# Patient Record
Sex: Female | Born: 1995 | Hispanic: No | Marital: Single | Smoking: Current every day smoker
Health system: Southern US, Community
[De-identification: ages and names within clinical notes are randomized; demographics above are authoritative.]

## PROBLEM LIST (undated history)

## (undated) ENCOUNTER — Ambulatory Visit (HOSPITAL_COMMUNITY): Admission: EM | Payer: Self-pay | Source: Home / Self Care

## (undated) DIAGNOSIS — J45909 Unspecified asthma, uncomplicated: Secondary | ICD-10-CM

## (undated) DIAGNOSIS — E8881 Metabolic syndrome: Secondary | ICD-10-CM

## (undated) HISTORY — DX: Metabolic syndrome: E88.81

---
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
## 2017-01-24 ENCOUNTER — Emergency Department (HOSPITAL_COMMUNITY): Payer: Medicaid Other

## 2017-01-24 ENCOUNTER — Encounter (HOSPITAL_COMMUNITY): Payer: Self-pay | Admitting: Emergency Medicine

## 2017-01-24 ENCOUNTER — Emergency Department (HOSPITAL_COMMUNITY)
Admission: EM | Admit: 2017-01-24 | Discharge: 2017-01-24 | Disposition: A | Discharge status: Home / Self Care | Payer: Medicaid Other | Attending: Emergency Medicine | Admitting: Emergency Medicine

## 2017-01-24 DIAGNOSIS — J45909 Unspecified asthma, uncomplicated: Secondary | ICD-10-CM | POA: Diagnosis not present

## 2017-01-24 DIAGNOSIS — F909 Attention-deficit hyperactivity disorder, unspecified type: Secondary | ICD-10-CM | POA: Insufficient documentation

## 2017-01-24 DIAGNOSIS — Y939 Activity, unspecified: Secondary | ICD-10-CM | POA: Diagnosis not present

## 2017-01-24 DIAGNOSIS — Y92410 Unspecified street and highway as the place of occurrence of the external cause: Secondary | ICD-10-CM | POA: Insufficient documentation

## 2017-01-24 DIAGNOSIS — M25531 Pain in right wrist: Secondary | ICD-10-CM | POA: Diagnosis not present

## 2017-01-24 DIAGNOSIS — Z7722 Contact with and (suspected) exposure to environmental tobacco smoke (acute) (chronic): Secondary | ICD-10-CM | POA: Insufficient documentation

## 2017-01-24 DIAGNOSIS — Y999 Unspecified external cause status: Secondary | ICD-10-CM | POA: Diagnosis not present

## 2017-01-24 DIAGNOSIS — S161XXA Strain of muscle, fascia and tendon at neck level, initial encounter: Secondary | ICD-10-CM | POA: Diagnosis not present

## 2017-01-24 DIAGNOSIS — S0990XA Unspecified injury of head, initial encounter: Secondary | ICD-10-CM | POA: Insufficient documentation

## 2017-01-24 DIAGNOSIS — S199XXA Unspecified injury of neck, initial encounter: Secondary | ICD-10-CM | POA: Diagnosis present

## 2017-01-24 LAB — I-STAT BETA HCG BLOOD, ED (MC, WL, AP ONLY): I-stat hCG, quantitative: <5 m[IU]/mL (ref <5)

## 2017-01-24 MED ORDER — IBUPROFEN 800 MG PO TABS: 800.0000 mg | ORAL_TABLET | Freq: Three times a day (TID) | ORAL | 0 refills | Status: DC | PRN

## 2017-01-24 NOTE — Discharge Instructions (Signed)
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
## 2017-01-24 NOTE — ED Notes (Signed)
Patient transported to X-ray 
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
## 2017-05-15 ENCOUNTER — Emergency Department (HOSPITAL_COMMUNITY)
Admission: EM | Admit: 2017-05-15 | Discharge: 2017-05-15 | Disposition: A | Discharge status: Home / Self Care | Payer: Medicaid Other | Attending: Emergency Medicine | Admitting: Emergency Medicine

## 2017-05-15 ENCOUNTER — Encounter (HOSPITAL_COMMUNITY): Payer: Self-pay | Admitting: *Deleted

## 2017-05-15 DIAGNOSIS — M546 Pain in thoracic spine: Secondary | ICD-10-CM

## 2017-05-15 DIAGNOSIS — R519 Headache, unspecified: Secondary | ICD-10-CM

## 2017-05-15 DIAGNOSIS — J45909 Unspecified asthma, uncomplicated: Secondary | ICD-10-CM | POA: Diagnosis not present

## 2017-05-15 DIAGNOSIS — R51 Headache: Secondary | ICD-10-CM | POA: Diagnosis present

## 2017-05-15 DIAGNOSIS — F17210 Nicotine dependence, cigarettes, uncomplicated: Secondary | ICD-10-CM | POA: Insufficient documentation

## 2017-05-15 HISTORY — DX: Unspecified asthma, uncomplicated: J45.909

## 2017-05-15 MED ORDER — METHOCARBAMOL 500 MG PO TABS: 500.0000 mg | ORAL_TABLET | Freq: Two times a day (BID) | ORAL | 0 refills | Status: DC | PRN

## 2017-05-15 MED ORDER — ACETAMINOPHEN 325 MG PO TABS
650.0000 mg | ORAL_TABLET | Freq: Once | ORAL | Status: AC
  Administered 2017-05-15: 650 mg via ORAL
  Filled 2017-05-15: qty 2

## 2017-05-15 MED ORDER — NAPROXEN 250 MG PO TABS: 250.0000 mg | ORAL_TABLET | Freq: Two times a day (BID) | ORAL | 0 refills | Status: DC
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
## 2017-08-23 ENCOUNTER — Ambulatory Visit: Payer: Medicaid Other | Admitting: Family Medicine

## 2017-09-04 ENCOUNTER — Ambulatory Visit: Payer: Self-pay | Admitting: Family Medicine

## 2017-10-04 ENCOUNTER — Emergency Department (HOSPITAL_COMMUNITY): Payer: Self-pay

## 2017-10-04 ENCOUNTER — Emergency Department (HOSPITAL_COMMUNITY)
Admission: EM | Admit: 2017-10-04 | Discharge: 2017-10-04 | Disposition: A | Discharge status: Home / Self Care | Payer: Self-pay | Attending: Emergency Medicine | Admitting: Emergency Medicine

## 2017-10-04 ENCOUNTER — Encounter (HOSPITAL_COMMUNITY): Payer: Self-pay | Admitting: Emergency Medicine

## 2017-10-04 DIAGNOSIS — Z79899 Other long term (current) drug therapy: Secondary | ICD-10-CM | POA: Insufficient documentation

## 2017-10-04 DIAGNOSIS — F909 Attention-deficit hyperactivity disorder, unspecified type: Secondary | ICD-10-CM | POA: Insufficient documentation

## 2017-10-04 DIAGNOSIS — Z7722 Contact with and (suspected) exposure to environmental tobacco smoke (acute) (chronic): Secondary | ICD-10-CM | POA: Insufficient documentation

## 2017-10-04 DIAGNOSIS — J45909 Unspecified asthma, uncomplicated: Secondary | ICD-10-CM | POA: Insufficient documentation

## 2017-10-04 DIAGNOSIS — L03113 Cellulitis of right upper limb: Secondary | ICD-10-CM | POA: Insufficient documentation

## 2017-10-04 MED ORDER — CEPHALEXIN 250 MG PO CAPS
500.0000 mg | ORAL_CAPSULE | Freq: Once | ORAL | Status: AC
  Administered 2017-10-04: 500 mg via ORAL
  Filled 2017-10-04: qty 2

## 2017-10-04 MED ORDER — CEPHALEXIN 500 MG PO CAPS: 500.0000 mg | ORAL_CAPSULE | Freq: Four times a day (QID) | ORAL | 0 refills | Status: AC

## 2017-10-04 NOTE — ED Triage Notes (Signed)
Pt to ER for evaluation of right hand redness and swelling onset yesterday, denies injury. Denies bug bite. Pt states "I don't know what happened." NAD.

## 2017-10-04 NOTE — ED Notes (Signed)
Patient verbalized understanding of discharge instructions and denies any further needs or questions at this time. VS stable. Patient ambulatory with steady gait.  

## 2017-10-04 NOTE — ED Provider Notes (Signed)
[facility] EMERGENCY DEPARTMENT Provider Note   CSN: [id number] Arrival date & time: 10/04/17  1632     History   Chief Complaint Chief Complaint  Patient presents with  . Hand Pain    HPI Envy Meno is a 21 y.o. female who presents to the emergency department with a chief complaint of right hand and wrist redness, warmth, and swelling that began yesterday.  The patient denies known trauma, injury, insect bite, or breaks in the skin.  She reports the redness and swelling began at the right wrist and have radiated up the back of the hand toward the fingers.  She denies fever, chills. no previous surgeries or injuries.  No history of gout or DM. She is right-handed.  The history is provided by the patient and a significant other. No language interpreter was used.    Past Medical History:  Diagnosis Date  . Asthma   . Metabolic syndrome 11/11/2012   From old records    Patient Active Problem List   Diagnosis Date Noted  . Mild persistent asthma without complication 08/25/2014  . Metabolic syndrome 11/11/2012  . ADHD (attention deficit hyperactivity disorder) 04/23/2012    History reviewed. No pertinent surgical history.  OB History    No data available       Home Medications    Prior to Admission medications   Medication Sig Start Date End Date Taking? Authorizing Provider  albuterol (PROVENTIL HFA;VENTOLIN HFA) 108 (90 BASE) MCG/ACT inhaler Inhale 2 puffs into the lungs every 4 (four) hours as needed. Always use spacer. Patient not taking: Reported on 01/24/2017 08/25/14   Tilman Neat, MD  beclomethasone (QVAR) 40 MCG/ACT inhaler Inhale 2 puffs into the lungs 2 (two) times daily. Always shake well and always use spacer. Patient not taking: Reported on 01/24/2017 08/25/14   Tilman Neat, MD  cephALEXin (KEFLEX) 500 MG capsule Take 1 capsule (500 mg total) by mouth 4 (four) times daily. 10/04/17 10/11/17  Nicha Hemann A, PA-C  methocarbamol  (ROBAXIN) 500 MG tablet Take 1 tablet (500 mg total) by mouth 2 (two) times daily as needed for muscle spasms. 05/15/17   Everlene Farrier, PA-C  naproxen (NAPROSYN) 250 MG tablet Take 1 tablet (250 mg total) by mouth 2 (two) times daily with a meal. 05/15/17   Everlene Farrier, PA-C    Family History History reviewed. No pertinent family history.  Social History Social History  Substance Use Topics  . Smoking status: Passive Smoke Exposure - Never Smoker  . Smokeless tobacco: Never Used  . Alcohol use No     Allergies   Penicillins   Review of Systems Review of Systems  Constitutional: Negative for activity change, chills and fever.  Respiratory: Negative for shortness of breath.   Cardiovascular: Negative for chest pain.  Gastrointestinal: Negative for abdominal pain.  Musculoskeletal: Positive for myalgias. Negative for arthralgias and back pain.  Skin: Positive for color change. Negative for rash.  Neurological: Negative for weakness and numbness.     Physical Exam Updated Vital Signs BP (!) 111/55 (BP Location: Left Arm)   Pulse 62   Temp 98.2 F (36.8 C) (Oral)   Resp 18   SpO2 100%   Physical Exam  Constitutional: No distress.  HENT:  Head: Normocephalic.  Eyes: Conjunctivae are normal.  Neck: Neck supple.  Cardiovascular: Normal rate and regular rhythm.  Exam reveals no gallop and no friction rub.   No murmur heard. Pulmonary/Chest: Effort normal. No respiratory distress.  Abdominal: Soft. She exhibits no distension.  Musculoskeletal:  Well demarcated area of erythema from the right wrist extending up to the MCPs on the dorsum of the right hand with warm and edema.  No involvement of the fingers.  No streaking noted.  Full active and passive range of motion of the right wrist.  No puncture wounds or skin breaks noted.  Moves all fingers independently.  Radial pulses are 2+ and symmetric.  Sensation is intact throughout the distal tips of the fingertips.     Neurological: She is alert.  Skin: Skin is warm. No rash noted.  Psychiatric: Her behavior is normal.  Nursing note and vitals reviewed.    ED Treatments / Results  Labs (all labs ordered are listed, but only abnormal results are displayed) Labs Reviewed - No data to display  EKG  EKG Interpretation None       Radiology Dg Wrist Complete Right  Result Date: 10/04/2017 CLINICAL DATA:  21 y/o F; right hand pain, redness, and swelling without trauma or injury. EXAM: RIGHT HAND - COMPLETE 3+ VIEW; RIGHT WRIST - COMPLETE 3+ VIEW COMPARISON:  01/24/2017 right wrist radiograph FINDINGS: Right hand: There is no evidence of fracture or dislocation. There is no evidence of arthropathy or other focal bone abnormality. Soft tissues are unremarkable. Right wrist: There is no evidence of fracture or dislocation. There is no evidence of arthropathy or other focal bone abnormality. Soft tissues are unremarkable. IMPRESSION: Negative. Electronically Signed   By: Mitzi Hansen M.D.   On: 10/04/2017 18:29   Dg Hand Complete Right  Result Date: 10/04/2017 CLINICAL DATA:  21 y/o F; right hand pain, redness, and swelling without trauma or injury. EXAM: RIGHT HAND - COMPLETE 3+ VIEW; RIGHT WRIST - COMPLETE 3+ VIEW COMPARISON:  01/24/2017 right wrist radiograph FINDINGS: Right hand: There is no evidence of fracture or dislocation. There is no evidence of arthropathy or other focal bone abnormality. Soft tissues are unremarkable. Right wrist: There is no evidence of fracture or dislocation. There is no evidence of arthropathy or other focal bone abnormality. Soft tissues are unremarkable. IMPRESSION: Negative. Electronically Signed   By: Mitzi Hansen M.D.   On: 10/04/2017 18:29    Procedures Procedures (including critical care time)  Medications Ordered in ED Medications  cephALEXin (KEFLEX) capsule 500 mg (500 mg Oral Given 10/04/17 1852)     Initial Impression / Assessment and  Plan / ED Course  I have reviewed the triage vital signs and the nursing notes.  Pertinent labs & imaging results that were available during my care of the patient were reviewed by me and considered in my medical decision making (see chart for details).     Pt is without risk factors for HIV; no recent use of steroids or other immunosuppressive medications; no Hx of diabetes.  Pt is without gross abscess for which I&D would be possible.  Area marked and pt encouraged to return if redness begins to streak, extends beyond the markings, and/or fever or nausea/vomiting develop.  Pt is alert, oriented, NAD, afebrile, non tachycardic, nonseptic and nontoxic appearing.  Pt to be d/c on oral Keflex with strict f/u instructions.  No acute distress.  Vital signs stable.  The patient is safe for discharge at this time.  Final Clinical Impressions(s) / ED Diagnoses   Final diagnoses:  Cellulitis of right hand    New Prescriptions New Prescriptions   CEPHALEXIN (KEFLEX) 500 MG CAPSULE    Take 1 capsule (500 mg total)  by mouth 4 (four) times daily.     Barkley BoardsMcDonald, Romell Wolden A, PA-C 10/04/17 Pamella [id number]    Haviland, Julie, MD 10/04/17 Windell [id number]

## 2017-10-04 NOTE — Discharge Instructions (Signed)
Take 1 tablet of Keflex (cephalexin) every 6 hours for the next 7 days.  Your first dose was given tonight in the emergency department.  You can take 600 mg of ibuprofen once every 6-8 hours for pain and inflammation control.  Ice can be applied for 15-20 minutes up to 3-4 times a day   To help with pain and inflammation.  Your symptoms should start to improve about 72 hours (3 days) after you have been taking antibiotics.  If the redness and swelling continues to worsen on or after Monday, please return to the emergency department or follow up with primary care for re-evaluation.  You can call the number on your discharge paperwork to get established with a primary care provider.  If you develop worsening symptoms such as fever, chills, or worsening redness, swelling, and pain to the right arm and hand after he been taking antibiotics for 72 hours, please return to the emergency department for re-evaluation.

## 2017-10-26 ENCOUNTER — Encounter (HOSPITAL_COMMUNITY): Payer: Self-pay | Admitting: Emergency Medicine

## 2017-10-26 ENCOUNTER — Emergency Department (HOSPITAL_COMMUNITY)
Admission: EM | Admit: 2017-10-26 | Discharge: 2017-10-26 | Disposition: A | Discharge status: Home / Self Care | Payer: Self-pay | Attending: Emergency Medicine | Admitting: Emergency Medicine

## 2017-10-26 ENCOUNTER — Emergency Department (HOSPITAL_COMMUNITY): Payer: Self-pay

## 2017-10-26 DIAGNOSIS — J4541 Moderate persistent asthma with (acute) exacerbation: Secondary | ICD-10-CM | POA: Insufficient documentation

## 2017-10-26 DIAGNOSIS — Z7722 Contact with and (suspected) exposure to environmental tobacco smoke (acute) (chronic): Secondary | ICD-10-CM | POA: Insufficient documentation

## 2017-10-26 MED ORDER — PREDNISONE 20 MG PO TABS
60.0000 mg | ORAL_TABLET | Freq: Once | ORAL | Status: AC
  Administered 2017-10-26: 60 mg via ORAL
  Filled 2017-10-26: qty 3

## 2017-10-26 MED ORDER — ALBUTEROL SULFATE HFA 108 (90 BASE) MCG/ACT IN AERS: 2.0000 | INHALATION_SPRAY | RESPIRATORY_TRACT | 2 refills | Status: DC | PRN

## 2017-10-26 MED ORDER — ALBUTEROL SULFATE (2.5 MG/3ML) 0.083% IN NEBU
5.0000 mg | INHALATION_SOLUTION | Freq: Once | RESPIRATORY_TRACT | Status: AC
  Administered 2017-10-26: 5 mg via RESPIRATORY_TRACT
  Filled 2017-10-26: qty 6

## 2017-10-26 MED ORDER — PREDNISONE 20 MG PO TABS: 40.0000 mg | ORAL_TABLET | Freq: Every day | ORAL | 0 refills | Status: DC

## 2017-10-26 NOTE — ED Notes (Signed)
Patient transported to X-ray 

## 2017-10-26 NOTE — ED Triage Notes (Signed)
Pt to ED for wheezing that started yesterday. Pt has tried albuterol inhaler w/ no relief. Pt states she has started to feel sick recently.

## 2017-10-26 NOTE — ED Provider Notes (Signed)
[facility]   CSN: [id number] Arrival date & time: 10/26/17  0451     History   Chief Complaint Chief Complaint  Patient presents with  . Wheezing    HPI Margaret Terry is a 21 y.o. female.  Patient presents to the emergency department for evaluation of cough, chest congestion, wheezing and increased asthma symptoms.  Patient reports she started feeling symptoms yesterday, worsened through the night.  No noted fevers.      Past Medical History:  Diagnosis Date  . Asthma   . Metabolic syndrome 11/11/2012   From old records    Patient Active Problem List   Diagnosis Date Noted  . Mild persistent asthma without complication 08/25/2014  . Metabolic syndrome 11/11/2012  . ADHD (attention deficit hyperactivity disorder) 04/23/2012    History reviewed. No pertinent surgical history.  OB History    No data available       Home Medications    Prior to Admission medications   Medication Sig Start Date End Date Taking? Authorizing Provider  albuterol (PROVENTIL HFA;VENTOLIN HFA) 108 (90 BASE) MCG/ACT inhaler Inhale 2 puffs into the lungs every 4 (four) hours as needed. Always use spacer. Patient not taking: Reported on 01/24/2017 08/25/14   Tilman NeatProse, Claudia C, MD  beclomethasone (QVAR) 40 MCG/ACT inhaler Inhale 2 puffs into the lungs 2 (two) times daily. Always shake well and always use spacer. Patient not taking: Reported on 01/24/2017 08/25/14   Tilman NeatProse, Claudia C, MD  methocarbamol (ROBAXIN) 500 MG tablet Take 1 tablet (500 mg total) by mouth 2 (two) times daily as needed for muscle spasms. 05/15/17   Everlene Farrieransie, William, PA-C  naproxen (NAPROSYN) 250 MG tablet Take 1 tablet (250 mg total) by mouth 2 (two) times daily with a meal. 05/15/17   Everlene Farrieransie, William, PA-C    Family History History reviewed. No pertinent family history.  Social History Social History   Tobacco Use  . Smoking status: Passive Smoke Exposure -  Never Smoker  . Smokeless tobacco: Never Used  Substance Use Topics  . Alcohol use: No  . Drug use: No     Allergies   Penicillins   Review of Systems Review of Systems  Respiratory: Positive for cough, shortness of breath and wheezing.   All other systems reviewed and are negative.    Physical Exam Updated Vital Signs BP 121/64 (BP Location: Right Arm)   Pulse 95   Temp 98.6 F (37 C) (Oral)   Resp (!) 24   Ht 5\' 6"  (1.676 m)   Wt 68 kg (150 lb)   LMP 10/03/2017 (Approximate)   SpO2 95%   BMI 24.21 kg/m   Physical Exam  Constitutional: She is oriented to person, place, and time. She appears well-developed and well-nourished. No distress.  HENT:  Head: Normocephalic and atraumatic.  Right Ear: Hearing normal.  Left Ear: Hearing normal.  Nose: Nose normal.  Mouth/Throat: Oropharynx is clear and moist and mucous membranes are normal.  Eyes: Conjunctivae and EOM are normal. Pupils are equal, round, and reactive to light.  Neck: Normal range of motion. Neck supple.  Cardiovascular: Regular rhythm, S1 normal and S2 normal. Exam reveals no gallop and no friction rub.  No murmur heard. Pulmonary/Chest: Effort normal and breath sounds normal. No respiratory distress. She exhibits no tenderness.  Abdominal: Soft. Normal appearance and bowel sounds are normal. There is no hepatosplenomegaly. There is no tenderness. There is no rebound, no guarding, no tenderness at McBurney's point  and negative Murphy's sign. No hernia.  Musculoskeletal: Normal range of motion.  Neurological: She is alert and oriented to person, place, and time. She has normal strength. No cranial nerve deficit or sensory deficit. Coordination normal. GCS eye subscore is 4. GCS verbal subscore is 5. GCS motor subscore is 6.  Skin: Skin is warm, dry and intact. No rash noted. No cyanosis.  Psychiatric: She has a normal mood and affect. Her speech is normal and behavior is normal. Thought content normal.    Nursing note and vitals reviewed.    ED Treatments / Results  Labs (all labs ordered are listed, but only abnormal results are displayed) Labs Reviewed - No data to display  EKG  EKG Interpretation None       Radiology Dg Chest 2 View  Result Date: 10/26/2017 CLINICAL DATA:  Wheezing starting yesterday. No relief with inhaler. EXAM: CHEST  2 VIEW COMPARISON:  None. FINDINGS: Shallow inspiration. Normal heart size and pulmonary vascularity. No focal airspace disease or consolidation in the lungs. No blunting of costophrenic angles. No pneumothorax. Mediastinal contours appear intact. IMPRESSION: No active cardiopulmonary disease. Electronically Signed   By: Burman NievesWilliam  Stevens M.D.   On: 10/26/2017 05:55    Procedures Procedures (including critical care time)  Medications Ordered in ED Medications  predniSONE (DELTASONE) tablet 60 mg (not administered)  albuterol (PROVENTIL) (2.5 MG/3ML) 0.083% nebulizer solution 5 mg (5 mg Nebulization Given 10/26/17 0533)     Initial Impression / Assessment and Plan / ED Course  I have reviewed the triage vital signs and the nursing notes.  Pertinent labs & imaging results that were available during my care of the patient were reviewed by me and considered in my medical decision making (see chart for details).     Patient presents with complaints of cough, chest congestion and worsening asthma symptoms.  She has not hypoxic or tachycardic.  She does not have a fever.  Chest x-ray negative, no signs of pneumonia.  Patient administered albuterol here in the ER, will continue bronchodilator therapy and add prednisone.  Final Clinical Impressions(s) / ED Diagnoses   Final diagnoses:  Moderate persistent asthma with exacerbation    ED Discharge Orders    None       Gilda CreasePollina, Earland Reish J, MD 10/26/17 [phone]

## 2017-10-26 NOTE — ED Notes (Signed)
Pt verbalized understanding of d/c instructions and has no further questions. Pt is stable, A&Ox4, VSS.  

## 2017-10-27 ENCOUNTER — Emergency Department (HOSPITAL_COMMUNITY)
Admission: EM | Admit: 2017-10-27 | Discharge: 2017-10-28 | Disposition: A | Discharge status: Home / Self Care | Payer: Self-pay | Attending: Emergency Medicine | Admitting: Emergency Medicine

## 2017-10-27 ENCOUNTER — Other Ambulatory Visit: Payer: Self-pay

## 2017-10-27 ENCOUNTER — Encounter (HOSPITAL_COMMUNITY): Payer: Self-pay | Admitting: Emergency Medicine

## 2017-10-27 DIAGNOSIS — M791 Myalgia, unspecified site: Secondary | ICD-10-CM | POA: Insufficient documentation

## 2017-10-27 DIAGNOSIS — R05 Cough: Secondary | ICD-10-CM | POA: Insufficient documentation

## 2017-10-27 DIAGNOSIS — F17290 Nicotine dependence, other tobacco product, uncomplicated: Secondary | ICD-10-CM | POA: Insufficient documentation

## 2017-10-27 DIAGNOSIS — J111 Influenza due to unidentified influenza virus with other respiratory manifestations: Secondary | ICD-10-CM

## 2017-10-27 DIAGNOSIS — R062 Wheezing: Secondary | ICD-10-CM | POA: Insufficient documentation

## 2017-10-27 DIAGNOSIS — F909 Attention-deficit hyperactivity disorder, unspecified type: Secondary | ICD-10-CM | POA: Insufficient documentation

## 2017-10-27 DIAGNOSIS — E871 Hypo-osmolality and hyponatremia: Secondary | ICD-10-CM | POA: Insufficient documentation

## 2017-10-27 DIAGNOSIS — R69 Illness, unspecified: Secondary | ICD-10-CM | POA: Insufficient documentation

## 2017-10-27 MED ORDER — ONDANSETRON HCL 4 MG/2ML IJ SOLN
4.0000 mg | Freq: Once | INTRAMUSCULAR | Status: AC
  Administered 2017-10-28: 4 mg via INTRAVENOUS
  Filled 2017-10-27: qty 2

## 2017-10-27 MED ORDER — METHYLPREDNISOLONE SODIUM SUCC 125 MG IJ SOLR
125.0000 mg | Freq: Once | INTRAMUSCULAR | Status: AC
  Administered 2017-10-28: 125 mg via INTRAVENOUS
  Filled 2017-10-27: qty 2

## 2017-10-27 MED ORDER — ACETAMINOPHEN 325 MG PO TABS
650.0000 mg | ORAL_TABLET | Freq: Once | ORAL | Status: AC
  Administered 2017-10-28: 650 mg via ORAL
  Filled 2017-10-27: qty 2

## 2017-10-27 MED ORDER — IPRATROPIUM-ALBUTEROL 0.5-2.5 (3) MG/3ML IN SOLN
3.0000 mL | Freq: Once | RESPIRATORY_TRACT | Status: AC
  Administered 2017-10-28: 3 mL via RESPIRATORY_TRACT
  Filled 2017-10-27: qty 3

## 2017-10-27 MED ORDER — KETOROLAC TROMETHAMINE 30 MG/ML IJ SOLN
30.0000 mg | Freq: Once | INTRAMUSCULAR | Status: AC
  Administered 2017-10-28: 30 mg via INTRAVENOUS
  Filled 2017-10-27: qty 1

## 2017-10-27 MED ORDER — SODIUM CHLORIDE 0.9 % IV BOLUS (SEPSIS)
1000.0000 mL | Freq: Once | INTRAVENOUS | Status: AC
  Administered 2017-10-28: 1000 mL via INTRAVENOUS

## 2017-10-27 NOTE — ED Provider Notes (Signed)
[facility] EMERGENCY DEPARTMENT Provider Note   CSN: [id number] Arrival date & time: 10/27/17  2104     History   Chief Complaint Chief Complaint  Patient presents with  . Fever  . Cough  . Generalized Body Aches    HPI Margaret Terry is a 21 y.o. female.  The history is provided by the patient.  She comes in with a 2-day history of subjective fever, chills, cough productive of green sputum.  She has had some wheezing.  There has been some posttussive emesis.  She is also complaining of generalized body aches, and a tight feeling in her chest.  She was seen in emergency department yesterday and given prednisone and albuterol and discharged with prescriptions for both of those.  She did not get the prescriptions filled.  She comes in with ongoing cough, fever, chills, body aches.  She says she has not had anything to eat in 2 days.  She denies any sick contacts.  She has not had the influenza immunization this season.  She does have a history of asthma and attention deficit disorder.  Past Medical History:  Diagnosis Date  . Asthma   . Metabolic syndrome 11/11/2012   From old records    Patient Active Problem List   Diagnosis Date Noted  . Mild persistent asthma without complication 08/25/2014  . Metabolic syndrome 11/11/2012  . ADHD (attention deficit hyperactivity disorder) 04/23/2012    History reviewed. No pertinent surgical history.  OB History    No data available       Home Medications    Prior to Admission medications   Medication Sig Start Date End Date Taking? Authorizing Provider  albuterol (PROVENTIL HFA;VENTOLIN HFA) 108 (90 BASE) MCG/ACT inhaler Inhale 2 puffs into the lungs every 4 (four) hours as needed. Always use spacer. Patient not taking: Reported on 01/24/2017 08/25/14   Tilman Neat, MD  albuterol (PROVENTIL HFA;VENTOLIN HFA) 108 (90 Base) MCG/ACT inhaler Inhale 2 puffs into the lungs every 4 (four) hours as needed for  wheezing or shortness of breath. 10/26/17   Gilda Crease, MD  beclomethasone (QVAR) 40 MCG/ACT inhaler Inhale 2 puffs into the lungs 2 (two) times daily. Always shake well and always use spacer. Patient not taking: Reported on 01/24/2017 08/25/14   Tilman Neat, MD  methocarbamol (ROBAXIN) 500 MG tablet Take 1 tablet (500 mg total) by mouth 2 (two) times daily as needed for muscle spasms. 05/15/17   Everlene Farrier, PA-C  naproxen (NAPROSYN) 250 MG tablet Take 1 tablet (250 mg total) by mouth 2 (two) times daily with a meal. 05/15/17   Everlene Farrier, PA-C  predniSONE (DELTASONE) 20 MG tablet Take 2 tablets (40 mg total) by mouth daily with breakfast. 10/26/17   Pollina, Canary Brim, MD    Family History No family history on file.  Social History Social History   Tobacco Use  . Smoking status: Current Some Day Smoker    Types: Cigars  . Smokeless tobacco: Never Used  Substance Use Topics  . Alcohol use: No  . Drug use: No     Allergies   Penicillins   Review of Systems Review of Systems  All other systems reviewed and are negative.    Physical Exam Updated Vital Signs BP 115/62 (BP Location: Right Arm)   Pulse (!) 105   Temp 100.3 F (37.9 C) (Oral) Comment (Src): pt mouth was not completely closed  Resp 20   Ht 5\' 7"  (1.702 m)  Wt 77.1 kg (170 lb)   LMP 10/03/2017 (Approximate)   SpO2 98%   BMI 26.63 kg/m   Physical Exam  Nursing note and vitals reviewed.  21 year old female, resting comfortably and in no acute distress. Vital signs are significant for low-grade fever and tachycardia. Oxygen saturation is 98%, which is normal.  She is having chills at the time of my exam. Head is normocephalic and atraumatic. PERRLA, EOMI. Oropharynx is clear. Neck is nontender and supple without adenopathy or JVD. Back is nontender and there is no CVA tenderness. Lungs have faint expiratory wheezes without rales or rhonchi. Chest is nontender. Heart has regular  rate and rhythm without murmur. Abdomen is soft, flat, nontender without masses or hepatosplenomegaly and peristalsis is hypoactive. Extremities have no cyanosis or edema, full range of motion is present. Skin is warm and dry without rash. Neurologic: Mental status is normal, cranial nerves are intact, there are no motor or sensory deficits.  ED Treatments / Results  Labs (all labs ordered are listed, but only abnormal results are displayed) Labs Reviewed  CBC WITH DIFFERENTIAL/PLATELET - Abnormal; Notable for the following components:      Result Value   WBC 13.2 (*)    Neutro Abs 9.7 (*)    All other components within normal limits  BASIC METABOLIC PANEL - Abnormal; Notable for the following components:   Sodium 130 (*)    Potassium 3.4 (*)    Chloride 100 (*)    CO2 20 (*)    All other components within normal limits  I-STAT BETA HCG BLOOD, ED ([facility], [facility], [facility] ONLY)    Radiology Dg Chest 2 View  Result Date: 10/28/2017 CLINICAL DATA:  Cough EXAM: CHEST  2 VIEW COMPARISON:  10/26/2017 FINDINGS: Left greater than right perihilar interstitial opacity. No focal consolidation or effusion. Normal heart size. No pneumothorax. IMPRESSION: Left greater than right perihilar interstitial opacity, could relate to viral process or bronchitis. No focal pulmonary infiltrate is seen. Electronically Signed   By: Jasmine PangKim  Fujinaga M.D.   On: 10/28/2017 00:42   Dg Chest 2 View  Result Date: 10/26/2017 CLINICAL DATA:  Wheezing starting yesterday. No relief with inhaler. EXAM: CHEST  2 VIEW COMPARISON:  None. FINDINGS: Shallow inspiration. Normal heart size and pulmonary vascularity. No focal airspace disease or consolidation in the lungs. No blunting of costophrenic angles. No pneumothorax. Mediastinal contours appear intact. IMPRESSION: No active cardiopulmonary disease. Electronically Signed   By: Burman NievesWilliam  Stevens M.D.   On: 10/26/2017 05:55    Procedures Procedures (including critical care  time)  Medications Ordered in ED Medications - No data to display   Initial Impression / Assessment and Plan / ED Course  I have reviewed the triage vital signs and the nursing notes.  Pertinent labs & imaging results that were available during my care of the patient were reviewed by me and considered in my medical decision making (see chart for details).  Influenza-like illness.  Old records are reviewed and she did have a negative chest x-ray yesterday.  Because of ongoing symptoms and development of fever, will repeat chest x-ray.  We will also repeat albuterol with ipratropium via nebulizer.  She is given a dose of methylprednisolone will be given IV fluids as well as acetaminophen, ketorolac, ondansetron.  She feels considerably better after above-noted treatment.  She no longer has chills.  She did have an episode of hyperventilation which has resolved.  Laboratory workup shows no evidence of pneumonia, mild hyponatremia which is  probably not clinically significant probably related to lack of oral intake over the last several days.  Since she is having difficulty filling prescription, she is given an albuterol inhaler to take home and she is given a dose of dexamethasone instead of course of oral steroids.  She is given prescription for metoclopramide for nausea.  Follow-up with PCP if not improving over the next several days.  Return precautions discussed.  Final Clinical Impressions(s) / ED Diagnoses   Final diagnoses:  Influenza-like illness  Hyponatremia    ED Discharge Orders        Ordered    metoCLOPramide (REGLAN) 10 MG tablet  Every 6 hours PRN     10/28/17 0126       Dione BoozeGlick, Izabellah Dadisman, MD 10/28/17 0130

## 2017-10-27 NOTE — ED Triage Notes (Signed)
C/o productive cough with green sputum, runny nose, congestion, fever, and generalized body aches since Friday.

## 2017-10-27 NOTE — ED Notes (Signed)
Pt's family member approached the desk stating the pt was having difficulty breathing. LS clear and equal at this time.

## 2017-10-28 ENCOUNTER — Emergency Department (HOSPITAL_COMMUNITY): Payer: Self-pay

## 2017-10-28 LAB — CBC WITH DIFFERENTIAL/PLATELET
Basophils Absolute: 0 10*3/uL (ref 0.0–0.1)
Basophils Relative: 0 %
EOS ABS: 0.2 10*3/uL (ref 0.0–0.7)
EOS PCT: 1 %
HCT: 38.3 % (ref 36.0–46.0)
HEMOGLOBIN: 12.8 g/dL (ref 12.0–15.0)
LYMPHS ABS: 2.4 10*3/uL (ref 0.7–4.0)
LYMPHS PCT: 18 %
MCH: 27.1 pg (ref 26.0–34.0)
MCHC: 33.4 g/dL (ref 30.0–36.0)
MCV: 81.1 fL (ref 78.0–100.0)
MONOS PCT: 7 %
Monocytes Absolute: 0.9 10*3/uL (ref 0.1–1.0)
NEUTROS PCT: 74 %
Neutro Abs: 9.7 10*3/uL — ABNORMAL HIGH (ref 1.7–7.7)
Platelets: 224 10*3/uL (ref 150–400)
RBC: 4.72 MIL/uL (ref 3.87–5.11)
RDW: 14.6 % (ref 11.5–15.5)
WBC: 13.2 10*3/uL — ABNORMAL HIGH (ref 4.0–10.5)

## 2017-10-28 LAB — BASIC METABOLIC PANEL
Anion gap: 10 (ref 5–15)
BUN: 11 mg/dL (ref 6–20)
CHLORIDE: 100 mmol/L — AB (ref 101–111)
CO2: 20 mmol/L — AB (ref 22–32)
CREATININE: 0.9 mg/dL (ref 0.44–1.00)
Calcium: 8.9 mg/dL (ref 8.9–10.3)
GFR calc Af Amer: >60 mL/min (ref >60)
GFR calc non Af Amer: >60 mL/min (ref >60)
GLUCOSE: 87 mg/dL (ref 65–99)
POTASSIUM: 3.4 mmol/L — AB (ref 3.5–5.1)
SODIUM: 130 mmol/L — AB (ref 135–145)

## 2017-10-28 LAB — I-STAT BETA HCG BLOOD, ED (MC, WL, AP ONLY)

## 2017-10-28 MED ORDER — DEXAMETHASONE SODIUM PHOSPHATE 10 MG/ML IJ SOLN
10.0000 mg | Freq: Once | INTRAMUSCULAR | Status: AC
  Administered 2017-10-28: 10 mg via INTRAVENOUS
  Filled 2017-10-28: qty 1

## 2017-10-28 MED ORDER — METOCLOPRAMIDE HCL 10 MG PO TABS: 10.0000 mg | ORAL_TABLET | Freq: Four times a day (QID) | ORAL | 0 refills | Status: DC | PRN

## 2017-10-28 MED ORDER — ALBUTEROL SULFATE HFA 108 (90 BASE) MCG/ACT IN AERS
2.0000 | INHALATION_SPRAY | RESPIRATORY_TRACT | Status: DC | PRN
  Administered 2017-10-28: 2 via RESPIRATORY_TRACT
  Filled 2017-10-28: qty 6.7

## 2017-10-28 NOTE — ED Notes (Signed)
Pt hyperventilating with this RN in room.  Began to c/o numbness to bilateral legs.  EDP aware.  Patient also had 1 episode of emesis, some of Tylenol pill noted in vomit.

## 2017-10-28 NOTE — Discharge Instructions (Signed)
Use the inhaler - two puffs every four hours as needed for coughing, or difficulty breathing.  Drink plenty of fluids.  Take acetaminophen or ibuprofen as needed for fever, aching.

## 2017-10-28 NOTE — ED Notes (Signed)
Pt transported to xray 

## 2017-10-28 NOTE — ED Notes (Signed)
Patient able to ambulate independently  

## 2017-10-31 ENCOUNTER — Emergency Department (HOSPITAL_COMMUNITY): Payer: Medicaid Other

## 2017-10-31 ENCOUNTER — Encounter (HOSPITAL_COMMUNITY): Payer: Self-pay | Admitting: Emergency Medicine

## 2017-10-31 ENCOUNTER — Emergency Department (HOSPITAL_COMMUNITY)
Admission: EM | Admit: 2017-10-31 | Discharge: 2017-11-01 | Disposition: A | Discharge status: Home / Self Care | Payer: Medicaid Other | Attending: Emergency Medicine | Admitting: Emergency Medicine

## 2017-10-31 DIAGNOSIS — J069 Acute upper respiratory infection, unspecified: Secondary | ICD-10-CM | POA: Insufficient documentation

## 2017-10-31 DIAGNOSIS — F17210 Nicotine dependence, cigarettes, uncomplicated: Secondary | ICD-10-CM | POA: Insufficient documentation

## 2017-10-31 DIAGNOSIS — J45901 Unspecified asthma with (acute) exacerbation: Secondary | ICD-10-CM | POA: Insufficient documentation

## 2017-10-31 DIAGNOSIS — J3489 Other specified disorders of nose and nasal sinuses: Secondary | ICD-10-CM | POA: Insufficient documentation

## 2017-10-31 DIAGNOSIS — F902 Attention-deficit hyperactivity disorder, combined type: Secondary | ICD-10-CM | POA: Insufficient documentation

## 2017-10-31 DIAGNOSIS — R112 Nausea with vomiting, unspecified: Secondary | ICD-10-CM | POA: Insufficient documentation

## 2017-10-31 LAB — COMPREHENSIVE METABOLIC PANEL
ALBUMIN: 3.7 g/dL (ref 3.5–5.0)
ALT: 16 U/L (ref 14–54)
ANION GAP: 6 (ref 5–15)
AST: 19 U/L (ref 15–41)
Alkaline Phosphatase: 67 U/L (ref 38–126)
BILIRUBIN TOTAL: 0.4 mg/dL (ref 0.3–1.2)
BUN: 10 mg/dL (ref 6–20)
CHLORIDE: 104 mmol/L (ref 101–111)
CO2: 27 mmol/L (ref 22–32)
Calcium: 9 mg/dL (ref 8.9–10.3)
Creatinine, Ser: 0.97 mg/dL (ref 0.44–1.00)
GFR calc Af Amer: >60 mL/min (ref >60)
GFR calc non Af Amer: >60 mL/min (ref >60)
GLUCOSE: 82 mg/dL (ref 65–99)
POTASSIUM: 3.5 mmol/L (ref 3.5–5.1)
SODIUM: 137 mmol/L (ref 135–145)
TOTAL PROTEIN: 7.5 g/dL (ref 6.5–8.1)

## 2017-10-31 LAB — CBC
HEMATOCRIT: 40.6 % (ref 36.0–46.0)
HEMOGLOBIN: 13.5 g/dL (ref 12.0–15.0)
MCH: 27.3 pg (ref 26.0–34.0)
MCHC: 33.3 g/dL (ref 30.0–36.0)
MCV: 82 fL (ref 78.0–100.0)
Platelets: 260 10*3/uL (ref 150–400)
RBC: 4.95 MIL/uL (ref 3.87–5.11)
RDW: 14.3 % (ref 11.5–15.5)
WBC: 14 10*3/uL — ABNORMAL HIGH (ref 4.0–10.5)

## 2017-10-31 LAB — I-STAT BETA HCG BLOOD, ED (MC, WL, AP ONLY): I-stat hCG, quantitative: <5 m[IU]/mL (ref <5)

## 2017-10-31 LAB — LIPASE, BLOOD: LIPASE: 20 U/L (ref 11–51)

## 2017-10-31 MED ORDER — IPRATROPIUM-ALBUTEROL 0.5-2.5 (3) MG/3ML IN SOLN
3.0000 mL | Freq: Once | RESPIRATORY_TRACT | Status: AC
  Administered 2017-10-31: 3 mL via RESPIRATORY_TRACT
  Filled 2017-10-31: qty 3

## 2017-10-31 MED ORDER — DEXAMETHASONE SODIUM PHOSPHATE 10 MG/ML IJ SOLN
10.0000 mg | Freq: Once | INTRAMUSCULAR | Status: AC
  Administered 2017-11-01: 10 mg via INTRAMUSCULAR
  Filled 2017-10-31: qty 1

## 2017-10-31 NOTE — ED Provider Notes (Signed)
[facility]-EMERGENCY DEPT Provider Note   CSN: [id number] Arrival date & time: 10/31/17  1751     History   Chief Complaint Chief Complaint  Patient presents with  . Cough  . Abdominal Pain    HPI Margaret Terry is a 21 y.o. female with a past medical history of asthma, who presents to ED for evaluation of cough, generalized bodyaches, wheezing for the past 2 days. She was discharged from the ED 4 days ago for similar symptoms. She reports improvement in symptoms after being discharged from the ED but states symptoms have returned. She has taken her home albuterol with mild improvement in symptoms. She also reports associated posttussive emesis yesterday. She denies any abdominal pain, fevers, diarrhea, constipation, urinary symptoms, appetite changes, productive cough, hemoptysis, chest pain.  HPI  Past Medical History:  Diagnosis Date  . Asthma   . Metabolic syndrome 11/11/2012   From old records    Patient Active Problem List   Diagnosis Date Noted  . Mild persistent asthma without complication 08/25/2014  . Metabolic syndrome 11/11/2012  . ADHD (attention deficit hyperactivity disorder) 04/23/2012    History reviewed. No pertinent surgical history.  OB History    No data available       Home Medications    Prior to Admission medications   Medication Sig Start Date End Date Taking? Authorizing Provider  albuterol (PROVENTIL HFA;VENTOLIN HFA) 108 (90 Base) MCG/ACT inhaler Inhale 1-2 puffs into the lungs every 6 (six) hours as needed for wheezing or shortness of breath. 11/01/17   Zephaniah Enyeart, PA-C  albuterol (PROVENTIL) (2.5 MG/3ML) 0.083% nebulizer solution Take 3 mLs (2.5 mg total) by nebulization every 6 (six) hours as needed for wheezing or shortness of breath. 11/01/17   Dietrich PatesKhatri, Jerad Dunlap, PA-C    Family History History reviewed. No pertinent family history.  Social History Social History   Tobacco Use  . Smoking status: Current  Some Day Smoker    Types: Cigars  . Smokeless tobacco: Never Used  Substance Use Topics  . Alcohol use: No  . Drug use: No     Allergies   Penicillins   Review of Systems Review of Systems  Constitutional: Positive for chills. Negative for appetite change and fever.  HENT: Positive for congestion and rhinorrhea. Negative for ear pain, sneezing and sore throat.   Eyes: Negative for photophobia and visual disturbance.  Respiratory: Positive for cough, chest tightness and wheezing. Negative for shortness of breath.   Cardiovascular: Negative for chest pain and palpitations.  Gastrointestinal: Positive for nausea and vomiting. Negative for abdominal pain, blood in stool, constipation and diarrhea.  Genitourinary: Negative for dysuria, hematuria and urgency.  Musculoskeletal: Negative for myalgias.  Skin: Negative for rash.  Neurological: Negative for dizziness, weakness and light-headedness.     Physical Exam Updated Vital Signs BP 132/78 (BP Location: Left Arm)   Pulse 96   Temp 98.7 F (37.1 C) (Oral)   Resp 18   Ht 5\' 6"  (1.676 m)   Wt 72.6 kg (160 lb)   LMP 10/31/2017   SpO2 100%   BMI 25.82 kg/m   Physical Exam  Constitutional: She appears well-developed and well-nourished. No distress.  HENT:  Head: Normocephalic and atraumatic.  Nose: Mucosal edema present.  Mouth/Throat: Uvula is midline and oropharynx is clear and moist. No tonsillar exudate.  Eyes: Conjunctivae and EOM are normal. Right eye exhibits no discharge. Left eye exhibits no discharge. No scleral icterus.  Neck: Normal range of motion. Neck  supple.  Cardiovascular: Normal rate, regular rhythm, normal heart sounds and intact distal pulses. Exam reveals no gallop and no friction rub.  No murmur heard. Pulmonary/Chest: Effort normal. No respiratory distress. She has wheezes (R sided).  Abdominal: Soft. Bowel sounds are normal. She exhibits no distension. There is no tenderness. There is no guarding.    Musculoskeletal: Normal range of motion. She exhibits no edema.  Neurological: She is alert. She exhibits normal muscle tone. Coordination normal.  Skin: Skin is warm and dry. No rash noted.  Psychiatric: She has a normal mood and affect.  Nursing note and vitals reviewed.    ED Treatments / Results  Labs (all labs ordered are listed, but only abnormal results are displayed) Labs Reviewed  CBC - Abnormal; Notable for the following components:      Result Value   WBC 14.0 (*)    All other components within normal limits  LIPASE, BLOOD  COMPREHENSIVE METABOLIC PANEL  I-STAT BETA HCG BLOOD, ED ([facility], [facility], [facility] ONLY)    EKG  EKG Interpretation None       Radiology Dg Chest 2 View  Result Date: 10/31/2017 CLINICAL DATA:  21 year old female with productive cough for 1 week. EXAM: CHEST  2 VIEW COMPARISON:  None. FINDINGS: The heart size and mediastinal contours are within normal limits. There is mild perihilar peribronchial thickening without focal parenchymal opacity. No pleural effusions or pneumothorax. The visualized skeletal structures are unremarkable. IMPRESSION: Mild perihilar peribronchial thickening consistent with reactive airway disease. No focal consolidations. Electronically Signed   By: Sande BrothersSerena  Chacko M.D.   On: 10/31/2017 19:57    Procedures Procedures (including critical care time)  Medications Ordered in ED Medications  ipratropium-albuterol (DUONEB) 0.5-2.5 (3) MG/3ML nebulizer solution 3 mL (3 mLs Nebulization Given 10/31/17 2349)  dexamethasone (DECADRON) injection 10 mg (10 mg Intramuscular Given 11/01/17 0004)  ipratropium-albuterol (DUONEB) 0.5-2.5 (3) MG/3ML nebulizer solution 3 mL (3 mLs Nebulization Given 11/01/17 0022)     Initial Impression / Assessment and Plan / ED Course  I have reviewed the triage vital signs and the nursing notes.  Pertinent labs & imaging results that were available during my care of the patient were reviewed by me and  considered in my medical decision making (see chart for details).     Patient presents to ED for evaluation of cough, generalized body aches and wheezing for the past 2 days.  She was seen here for similar symptoms 4 days ago states that she did improve when she went home.  She states that the symptoms started again 2 days ago.  She has her home inhaler with mild to no improvement in her symptoms.  On physical exam patient is nontoxic-appearing and in no acute distress.  She is speaking in complete sentences.  She has no abdominal or chest tenderness to palpation.  She does have wheezing noted on the right lung fields.  She is afebrile with no history of fever.  Satting at 100% on room air.  She is overall well-appearing.  Will give patient DuoNeb treatment, Decadron injection and reassess.  Her labs are unremarkable today.  Chest x-ray with signs of asthma.  Patient reports improvement in symptoms with DuoNeb. Wheezing much improved on auscultation. Given decadron. Patient requests a second treatment. Will reassess after.  Patient reports complete resolution of her symptoms with second DuoNeb treatment.  We will give refills as well as Tessalon Perles to help with cough. Patient appears stable for discharge at this time. Strict return  precautions given.  Final Clinical Impressions(s) / ED Diagnoses   Final diagnoses:  Viral upper respiratory tract infection  Exacerbation of asthma, unspecified asthma severity, unspecified whether persistent    ED Discharge Orders        Ordered    albuterol (PROVENTIL HFA;VENTOLIN HFA) 108 (90 Base) MCG/ACT inhaler  Every 6 hours PRN     11/01/17 0013    albuterol (PROVENTIL) (2.5 MG/3ML) 0.083% nebulizer solution  Every 6 hours PRN     11/01/17 0013       Dietrich Pates, PA-C 11/01/17 0041    Lorre Nick, MD 11/04/17 [phone]

## 2017-10-31 NOTE — ED Triage Notes (Signed)
Patient c/o productive cough and congestion x1 week with N/V and abdominal pain starting today. [facility]

## 2017-11-01 MED ORDER — ALBUTEROL SULFATE HFA 108 (90 BASE) MCG/ACT IN AERS: 1.0000 | INHALATION_SPRAY | Freq: Four times a day (QID) | RESPIRATORY_TRACT | 0 refills | Status: DC | PRN

## 2017-11-01 MED ORDER — BENZONATATE 100 MG PO CAPS: 200.0000 mg | ORAL_CAPSULE | Freq: Three times a day (TID) | ORAL | 0 refills | Status: DC

## 2017-11-01 MED ORDER — IPRATROPIUM-ALBUTEROL 0.5-2.5 (3) MG/3ML IN SOLN
3.0000 mL | Freq: Once | RESPIRATORY_TRACT | Status: AC
  Administered 2017-11-01: 3 mL via RESPIRATORY_TRACT
  Filled 2017-11-01: qty 3

## 2017-11-01 MED ORDER — ALBUTEROL SULFATE (2.5 MG/3ML) 0.083% IN NEBU: 2.5000 mg | INHALATION_SOLUTION | Freq: Four times a day (QID) | RESPIRATORY_TRACT | 12 refills | Status: DC | PRN

## 2017-11-01 NOTE — Discharge Instructions (Signed)
Please read attached information regarding your condition. Use albuterol inhaler and nebulizer treatments as needed. Return to ED for worsening cough, wheezing, chest pain, shortness of breath, coughing up blood or leg swelling.

## 2017-11-07 ENCOUNTER — Encounter (HOSPITAL_COMMUNITY): Payer: Self-pay

## 2017-11-07 ENCOUNTER — Other Ambulatory Visit: Payer: Self-pay

## 2017-11-07 ENCOUNTER — Emergency Department (HOSPITAL_COMMUNITY)
Admission: EM | Admit: 2017-11-07 | Discharge: 2017-11-07 | Disposition: A | Discharge status: Home / Self Care | Payer: No Typology Code available for payment source | Attending: Emergency Medicine | Admitting: Emergency Medicine

## 2017-11-07 DIAGNOSIS — S6992XA Unspecified injury of left wrist, hand and finger(s), initial encounter: Secondary | ICD-10-CM | POA: Insufficient documentation

## 2017-11-07 DIAGNOSIS — Y9389 Activity, other specified: Secondary | ICD-10-CM | POA: Diagnosis not present

## 2017-11-07 DIAGNOSIS — M545 Low back pain, unspecified: Secondary | ICD-10-CM

## 2017-11-07 DIAGNOSIS — S6991XA Unspecified injury of right wrist, hand and finger(s), initial encounter: Secondary | ICD-10-CM | POA: Diagnosis present

## 2017-11-07 DIAGNOSIS — J45909 Unspecified asthma, uncomplicated: Secondary | ICD-10-CM | POA: Diagnosis not present

## 2017-11-07 DIAGNOSIS — Y929 Unspecified place or not applicable: Secondary | ICD-10-CM | POA: Diagnosis not present

## 2017-11-07 DIAGNOSIS — Y998 Other external cause status: Secondary | ICD-10-CM | POA: Diagnosis not present

## 2017-11-07 DIAGNOSIS — S60419A Abrasion of unspecified finger, initial encounter: Secondary | ICD-10-CM | POA: Diagnosis not present

## 2017-11-07 MED ORDER — BACITRACIN ZINC 500 UNIT/GM EX OINT
1.0000 | TOPICAL_OINTMENT | Freq: Two times a day (BID) | CUTANEOUS | 0 refills | Status: DC
Start: 2017-11-07 — End: 2020-01-13

## 2017-11-07 MED ORDER — NAPROXEN 375 MG PO TABS: 375.0000 mg | ORAL_TABLET | Freq: Two times a day (BID) | ORAL | 0 refills | Status: DC

## 2017-11-07 MED ORDER — TETANUS-DIPHTH-ACELL PERTUSSIS 5-2.5-18.5 LF-MCG/0.5 IM SUSP
0.5000 mL | Freq: Once | INTRAMUSCULAR | Status: DC
  Filled 2017-11-07: qty 0.5
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
## 2017-12-31 ENCOUNTER — Ambulatory Visit (INDEPENDENT_AMBULATORY_CARE_PROVIDER_SITE_OTHER): Payer: Self-pay

## 2017-12-31 ENCOUNTER — Encounter (HOSPITAL_COMMUNITY): Payer: Self-pay | Admitting: Emergency Medicine

## 2017-12-31 ENCOUNTER — Ambulatory Visit (HOSPITAL_COMMUNITY)
Admission: EM | Admit: 2017-12-31 | Discharge: 2017-12-31 | Disposition: A | Discharge status: Home / Self Care | Payer: Self-pay | Attending: Family Medicine | Admitting: Family Medicine

## 2017-12-31 ENCOUNTER — Other Ambulatory Visit: Payer: Self-pay

## 2017-12-31 DIAGNOSIS — M25531 Pain in right wrist: Secondary | ICD-10-CM

## 2017-12-31 NOTE — Discharge Instructions (Signed)
You may use over the counter ibuprofen or acetaminophen as needed.  ° °

## 2017-12-31 NOTE — ED Triage Notes (Signed)
Reports right wrist pain.  Margaret Terry while playing basketball yesterday.  Reports landing on her right wrist. Denies numbness or tingling in fingers.  Patient holding cell phone in right hand and texting with the same hand.  Left radial pulse is 2 +.

## 2018-01-01 NOTE — ED Provider Notes (Signed)
  [facility]   [id number] 12/31/17 Arrival Time: 1418  ASSESSMENT & PLAN:  1. Right wrist pain    Imaging: Dg Wrist Complete Right  Result Date: 12/31/2017 CLINICAL DATA:  Status post fall while playing basketball yesterday with pain in the right wrist. EXAM: RIGHT WRIST - COMPLETE 3+ VIEW COMPARISON:  October 04, 2017 FINDINGS: There is no evidence of fracture or dislocation. There is no evidence of arthropathy or other focal bone abnormality. Soft tissues are unremarkable. IMPRESSION: Negative. Electronically Signed   By: Sherian ReinWei-Chen  Lin M.D.   On: 12/31/2017 15:12   Recommend time/ice/NSAID with food for the next several days. Activities as she tolerates. May f/u here if not seeing steady improvement. Reviewed expectations re: course of current medical issues. Questions answered. Outlined signs and symptoms indicating need for more acute intervention. Patient verbalized understanding. After Visit Summary given.  SUBJECTIVE: History from: patient. Margaret Terry is a 22 y.o. female who reports:  Reports  localized pain of her right wrist; persistent; described as aching without radiation Onset: abrupt, yesterday Injury/trama: yes, reports falling while playing basketball; "think I landed on my wrist" Severity: mild to moderate Progression: stable Relieved by: keeping wrist still Worsened by: certain movement Associated symptoms: none reported Extremity sensation changes or weakness: none Self treatment: has not tried OTCs for relief of pain  History of similar: no  ROS: As per HPI.   OBJECTIVE:  Vitals:   12/31/17 1458  BP: 117/64  Pulse: 63  Resp: 18  Temp: 98.3 F (36.8 C)  TempSrc: Oral  SpO2: 98%    General appearance: alert; no distress Extremities: no cyanosis or edema; symmetrical with no gross deformities; diffuse tenderness over her right wrist with no swelling and no bruising; ROM: normal but with reported discomfort CV: normal extremity  capillary refill Skin: warm and dry Neurologic: normal gait; normal symmetric reflexes in all extremities; normal sensation in all extremities Psychological: alert and cooperative; normal mood and affect  Allergies  Allergen Reactions  . Penicillins     Pt's family member states pt's mother is allergic to penicillins so pt does not take them    Past Medical History:  Diagnosis Date  . Asthma   . Metabolic syndrome 11/11/2012   From old records   Social History   Socioeconomic History  . Marital status: Single    Spouse name: Not on file  . Number of children: Not on file  . Years of education: Not on file  . Highest education level: Not on file  Social Needs  . Financial resource strain: Not on file  . Food insecurity - worry: Not on file  . Food insecurity - inability: Not on file  . Transportation needs - medical: Not on file  . Transportation needs - non-medical: Not on file  Occupational History  . Not on file  Tobacco Use  . Smoking status: Never Smoker  . Smokeless tobacco: Never Used  Substance and Sexual Activity  . Alcohol use: No  . Drug use: No  . Sexual activity: Not on file  Other Topics Concern  . Not on file  Social History Narrative  . Not on file   Family History  Problem Relation Age of Onset  . Healthy Mother      Mardella LaymanHagler, Chinmayi Rumer, MD 01/01/18 [id number]

## 2018-01-05 ENCOUNTER — Emergency Department (HOSPITAL_COMMUNITY): Payer: Self-pay

## 2018-01-05 ENCOUNTER — Encounter (HOSPITAL_COMMUNITY): Payer: Self-pay | Admitting: Emergency Medicine

## 2018-01-05 ENCOUNTER — Observation Stay (HOSPITAL_COMMUNITY)
Admission: EM | Admit: 2018-01-05 | Discharge: 2018-01-06 | Disposition: A | Discharge status: Home / Self Care | Payer: Self-pay | Attending: Emergency Medicine | Admitting: Emergency Medicine

## 2018-01-05 DIAGNOSIS — F17290 Nicotine dependence, other tobacco product, uncomplicated: Secondary | ICD-10-CM | POA: Insufficient documentation

## 2018-01-05 DIAGNOSIS — S299XXA Unspecified injury of thorax, initial encounter: Secondary | ICD-10-CM | POA: Insufficient documentation

## 2018-01-05 DIAGNOSIS — S1183XA Puncture wound without foreign body of other specified part of neck, initial encounter: Principal | ICD-10-CM | POA: Insufficient documentation

## 2018-01-05 DIAGNOSIS — J45909 Unspecified asthma, uncomplicated: Secondary | ICD-10-CM | POA: Insufficient documentation

## 2018-01-05 DIAGNOSIS — Y9389 Activity, other specified: Secondary | ICD-10-CM | POA: Insufficient documentation

## 2018-01-05 DIAGNOSIS — Z79899 Other long term (current) drug therapy: Secondary | ICD-10-CM | POA: Insufficient documentation

## 2018-01-05 DIAGNOSIS — D571 Sickle-cell disease without crisis: Secondary | ICD-10-CM | POA: Insufficient documentation

## 2018-01-05 DIAGNOSIS — W3400XA Accidental discharge from unspecified firearms or gun, initial encounter: Secondary | ICD-10-CM | POA: Insufficient documentation

## 2018-01-05 LAB — CBC
HCT: 37.8 % (ref 36.0–46.0)
HEMOGLOBIN: 12.1 g/dL (ref 12.0–15.0)
MCH: 26.8 pg (ref 26.0–34.0)
MCHC: 32 g/dL (ref 30.0–36.0)
MCV: 83.8 fL (ref 78.0–100.0)
Platelets: 224 10*3/uL (ref 150–400)
RBC: 4.51 MIL/uL (ref 3.87–5.11)
RDW: 15 % (ref 11.5–15.5)
WBC: 5.4 10*3/uL (ref 4.0–10.5)

## 2018-01-05 LAB — PREPARE FRESH FROZEN PLASMA
UNIT DIVISION: 0
Unit division: 0

## 2018-01-05 LAB — I-STAT CHEM 8, ED
BUN: 13 mg/dL (ref 6–20)
CALCIUM ION: 1.15 mmol/L (ref 1.15–1.40)
CHLORIDE: 104 mmol/L (ref 101–111)
Creatinine, Ser: 0.9 mg/dL (ref 0.44–1.00)
Glucose, Bld: 108 mg/dL — ABNORMAL HIGH (ref 65–99)
HEMATOCRIT: 39 % (ref 36.0–46.0)
Hemoglobin: 13.3 g/dL (ref 12.0–15.0)
POTASSIUM: 3.6 mmol/L (ref 3.5–5.1)
SODIUM: 140 mmol/L (ref 135–145)
TCO2: 24 mmol/L (ref 22–32)

## 2018-01-05 LAB — BPAM FFP
BLOOD PRODUCT EXPIRATION DATE: 201902172359
Blood Product Expiration Date: 201902172359
ISSUE DATE / TIME: 201902031916
ISSUE DATE / TIME: 201902031916
UNIT TYPE AND RH: 6200
Unit Type and Rh: 6200

## 2018-01-05 LAB — I-STAT CG4 LACTIC ACID, ED: Lactic Acid, Venous: 1.88 mmol/L (ref 0.5–1.9)

## 2018-01-05 LAB — COMPREHENSIVE METABOLIC PANEL
ALBUMIN: 3.4 g/dL — AB (ref 3.5–5.0)
ALK PHOS: 59 U/L (ref 38–126)
ALT: 15 U/L (ref 14–54)
ANION GAP: 8 (ref 5–15)
AST: 22 U/L (ref 15–41)
BILIRUBIN TOTAL: 0.3 mg/dL (ref 0.3–1.2)
BUN: 12 mg/dL (ref 6–20)
CALCIUM: 8.6 mg/dL — AB (ref 8.9–10.3)
CO2: 23 mmol/L (ref 22–32)
Chloride: 107 mmol/L (ref 101–111)
Creatinine, Ser: 0.93 mg/dL (ref 0.44–1.00)
GFR calc Af Amer: >60 mL/min (ref >60)
GFR calc non Af Amer: >60 mL/min (ref >60)
GLUCOSE: 114 mg/dL — AB (ref 65–99)
Potassium: 3.7 mmol/L (ref 3.5–5.1)
SODIUM: 138 mmol/L (ref 135–145)
TOTAL PROTEIN: 6.3 g/dL — AB (ref 6.5–8.1)

## 2018-01-05 LAB — ABO/RH: ABO/RH(D): O POS

## 2018-01-05 LAB — PROTIME-INR
INR: 1.06
Prothrombin Time: 13.7 seconds (ref 11.4–15.2)

## 2018-01-05 LAB — ETHANOL

## 2018-01-05 MED ORDER — MORPHINE SULFATE (PF) 4 MG/ML IV SOLN
2.0000 mg | INTRAVENOUS | Status: DC | PRN
  Administered 2018-01-06: 2 mg via INTRAVENOUS
  Filled 2018-01-05: qty 1

## 2018-01-05 MED ORDER — HYDROMORPHONE HCL 1 MG/ML IJ SOLN: 1.0000 mg | INTRAMUSCULAR | Status: DC | PRN

## 2018-01-05 MED ORDER — FENTANYL CITRATE (PF) 100 MCG/2ML IJ SOLN
INTRAMUSCULAR | Status: AC
  Administered 2018-01-05: 50 ug via INTRAVENOUS
  Filled 2018-01-05: qty 2

## 2018-01-05 MED ORDER — IOPAMIDOL (ISOVUE-300) INJECTION 61%
INTRAVENOUS | Status: AC
  Administered 2018-01-05: 75 mL
  Filled 2018-01-05: qty 75

## 2018-01-05 MED ORDER — OXYCODONE HCL 5 MG PO TABS
10.0000 mg | ORAL_TABLET | ORAL | Status: DC | PRN
  Administered 2018-01-06 (×2): 10 mg via ORAL
  Filled 2018-01-05 (×2): qty 2

## 2018-01-05 MED ORDER — FENTANYL CITRATE (PF) 100 MCG/2ML IJ SOLN
50.0000 ug | Freq: Once | INTRAMUSCULAR | Status: AC
Start: 2018-01-05 — End: 2018-01-05
  Administered 2018-01-05: 50 ug via INTRAVENOUS

## 2018-01-05 MED ORDER — MORPHINE SULFATE (PF) 4 MG/ML IV SOLN: 1.0000 mg | INTRAVENOUS | Status: DC | PRN

## 2018-01-05 MED ORDER — ONDANSETRON 4 MG PO TBDP: 4.0000 mg | ORAL_TABLET | Freq: Four times a day (QID) | ORAL | Status: DC | PRN

## 2018-01-05 MED ORDER — ONDANSETRON HCL 4 MG/2ML IJ SOLN: 4.0000 mg | Freq: Four times a day (QID) | INTRAMUSCULAR | Status: DC | PRN

## 2018-01-05 MED ORDER — OXYCODONE HCL 5 MG PO TABS: 5.0000 mg | ORAL_TABLET | ORAL | Status: DC | PRN

## 2018-01-05 MED ORDER — MORPHINE SULFATE (PF) 4 MG/ML IV SOLN: 4.0000 mg | INTRAVENOUS | Status: DC | PRN

## 2018-01-05 MED ORDER — KETOROLAC TROMETHAMINE 30 MG/ML IJ SOLN
30.0000 mg | Freq: Once | INTRAMUSCULAR | Status: AC
  Administered 2018-01-05: 30 mg via INTRAVENOUS
  Filled 2018-01-05: qty 1

## 2018-01-05 MED ORDER — POTASSIUM CHLORIDE IN NACL 20-0.9 MEQ/L-% IV SOLN
INTRAVENOUS | Status: DC
  Administered 2018-01-05 – 2018-01-06 (×2): via INTRAVENOUS
  Filled 2018-01-05 (×2): qty 1000

## 2018-01-05 MED ORDER — METHOCARBAMOL 750 MG PO TABS: 750.0000 mg | ORAL_TABLET | Freq: Four times a day (QID) | ORAL | Status: DC | PRN

## 2018-01-05 MED ORDER — ENOXAPARIN SODIUM 40 MG/0.4ML ~~LOC~~ SOLN
40.0000 mg | SUBCUTANEOUS | Status: DC
  Administered 2018-01-06: 40 mg via SUBCUTANEOUS
  Filled 2018-01-05: qty 0.4

## 2018-01-05 MED ORDER — FENTANYL CITRATE (PF) 100 MCG/2ML IJ SOLN
INTRAMUSCULAR | Status: AC
  Filled 2018-01-05: qty 2

## 2018-01-05 NOTE — ED Notes (Signed)
Vital signs stable. 

## 2018-01-05 NOTE — ED Notes (Signed)
Family updated as to patient's status.

## 2018-01-05 NOTE — ED Notes (Signed)
Pt transported to CT with RN on CCM
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
## 2018-01-05 NOTE — ED Notes (Signed)
Attempted to call report, charge RN will return call for report

## 2018-01-05 NOTE — ED Notes (Signed)
Dr. Magnus IvanBlackman at bedside speaking with mother
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
## 2018-01-05 NOTE — ED Notes (Signed)
Patient transported to CT with Autumn, RN

## 2018-01-06 LAB — BPAM RBC
BLOOD PRODUCT EXPIRATION DATE: 201902062359
BLOOD PRODUCT EXPIRATION DATE: 201903052359
ISSUE DATE / TIME: 201902031915
ISSUE DATE / TIME: 201902031915
UNIT TYPE AND RH: 9500
Unit Type and Rh: 9500

## 2018-01-06 LAB — CBC
HEMATOCRIT: 36.3 % (ref 36.0–46.0)
Hemoglobin: 11.6 g/dL — ABNORMAL LOW (ref 12.0–15.0)
MCH: 26.5 pg (ref 26.0–34.0)
MCHC: 32 g/dL (ref 30.0–36.0)
MCV: 83.1 fL (ref 78.0–100.0)
PLATELETS: 239 10*3/uL (ref 150–400)
RBC: 4.37 MIL/uL (ref 3.87–5.11)
RDW: 15 % (ref 11.5–15.5)
WBC: 8.8 10*3/uL (ref 4.0–10.5)

## 2018-01-06 LAB — TYPE AND SCREEN
ABO/RH(D): O POS
Antibody Screen: NEGATIVE
UNIT DIVISION: 0
Unit division: 0

## 2018-01-06 LAB — HIV ANTIBODY (ROUTINE TESTING W REFLEX): HIV Screen 4th Generation wRfx: NONREACTIVE

## 2018-01-06 LAB — BASIC METABOLIC PANEL
ANION GAP: 12 (ref 5–15)
BUN: 11 mg/dL (ref 6–20)
CO2: 20 mmol/L — ABNORMAL LOW (ref 22–32)
Calcium: 8.7 mg/dL — ABNORMAL LOW (ref 8.9–10.3)
Chloride: 108 mmol/L (ref 101–111)
Creatinine, Ser: 0.81 mg/dL (ref 0.44–1.00)
GFR calc Af Amer: >60 mL/min (ref >60)
GLUCOSE: 100 mg/dL — AB (ref 65–99)
POTASSIUM: 3.7 mmol/L (ref 3.5–5.1)
Sodium: 140 mmol/L (ref 135–145)

## 2018-01-06 MED ORDER — ENOXAPARIN SODIUM 30 MG/0.3ML ~~LOC~~ SOLN: 30.0000 mg | SUBCUTANEOUS | Status: DC

## 2018-01-06 MED ORDER — OXYCODONE HCL 5 MG PO TABS: 5.0000 mg | ORAL_TABLET | Freq: Four times a day (QID) | ORAL | 0 refills | Status: DC | PRN

## 2018-01-06 MED FILL — oxyCODONE HCL 5 MG TABS: 5 | 3 days supply | Qty: 12 | Fill #0
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
## 2018-01-06 NOTE — Discharge Instructions (Signed)
Wound Care  - Keep wound clean and dry, okay to shower but do not bath or submerge in water for extended periods of time - If you notice the area becomes red, you develop fever, or notice a change in the discharge please call our clinic   1. PAIN CONTROL:  1. Pain is best controlled by a usual combination of three different methods TOGETHER:  1. Ice/Heat 2. Over the counter pain medication 3. Prescription pain medication 2. Most patients will experience some swelling and bruising around wounds. Ice packs or heating pads (30-60 minutes up to 6 times a day) will help. Use ice for the first few days to help decrease swelling and bruising, then switch to heat to help relax tight/sore spots and speed recovery. Some people prefer to use ice alone, heat alone, alternating between ice & heat. Experiment to what works for you. Swelling and bruising can take several weeks to resolve.  3. It is helpful to take an over-the-counter pain medication regularly for the first few weeks. Choose one of the following that works best for you:  1. Naproxen (Aleve, etc) Two 220mg  tabs twice a day 2. Ibuprofen (Advil, etc) Three 200mg  tabs four times a day (every meal & bedtime) 3. Acetaminophen (Tylenol, etc) 500-650mg  four times a day (every meal & bedtime) 4. A prescription for pain medication (such as oxycodone, hydrocodone, etc) should be given to you upon discharge. Take your pain medication as prescribed.  1. If you are having problems/concerns with the prescription medicine (does not control pain, nausea, vomiting, rash, itching, etc), please call us [phone] to see if we need to switch you to a different pain medicine that will work better for you and/or control your side effect better. 2. If you need a refill on your pain medication, please contact your pharmacy. They will contact our office to request authorization. Prescriptions will not be filled after 5 pm or on week-ends. 4. Avoid getting constipated.  When taking pain medications, it is common to experience some constipation. Increasing fluid intake and taking a fiber supplement (such as Metamucil, Citrucel, FiberCon, MiraLax, etc) 1-2 times a day regularly will usually help prevent this problem from occurring. A mild laxative (prune juice, Milk of Magnesia, MiraLax, etc) should be taken according to package directions if there are no bowel movements after 48 hours.  5. Watch out for diarrhea. If you have many loose bowel movements, simplify your diet to bland foods & liquids for a few days. Stop any stool softeners and decrease your fiber supplement. Switching to mild anti-diarrheal medications (Kayopectate, Pepto Bismol) can help. If this worsens or does not improve, please call us. 6. Wash / shower every day. You may shower daily and replace your bandges after showering. No bathing or submerging your wounds in water until they heal. 7. FOLLOW UP in our office  1. You have a follow up appointment on 02/12 at 9:15 AM  WHEN TO CALL US [phone]) [phone]:  1. Poor pain control 2. Reactions / problems with new medications (rash/itching, nausea, etc)  3. Fever over 101.5 F (38.5 C) 4. Worsening swelling or bruising 5. Continued bleeding from wounds. 6. Increased pain, redness, or drainage from the wounds which could be signs of infection  The clinic staff is available to answer your questions during regular business hours (8:30am-5pm). Please dont hesitate to call and ask to speak to one of our nurses for clinical concerns.  If you have a medical emergency, go to the  nearest emergency room or call 911.  A surgeon from [facility] is always on call at the [facility], [address], [place], [place], [place] [address] ?  MAIN: [phone] ? TOLL FREE: [phone] ?  FAX [phone]  [email or web address]
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
## 2018-01-06 NOTE — Progress Notes (Addendum)
1643 Patient discharged to home. Verbalizes understanding of all discharge instructions including incision care, discharge medications, and follow up MD visits. Patient accompanied by mother and siblings. Match letter provided to patient for Rx.   Patient informed she can stop by Dr. Dixon BoosWyatt's office tomorrow to obtain a work note.

## 2018-01-07 LAB — CDS SEROLOGY

## 2018-01-08 ENCOUNTER — Encounter (HOSPITAL_COMMUNITY): Payer: Self-pay | Admitting: Emergency Medicine

## 2018-01-19 ENCOUNTER — Emergency Department (HOSPITAL_COMMUNITY)
Admission: EM | Admit: 2018-01-19 | Discharge: 2018-01-20 | Disposition: A | Discharge status: Home / Self Care | Payer: Medicaid Other | Attending: Emergency Medicine | Admitting: Emergency Medicine

## 2018-01-19 ENCOUNTER — Other Ambulatory Visit: Payer: Self-pay

## 2018-01-19 ENCOUNTER — Encounter (HOSPITAL_COMMUNITY): Payer: Self-pay

## 2018-01-19 DIAGNOSIS — J4530 Mild persistent asthma, uncomplicated: Secondary | ICD-10-CM | POA: Insufficient documentation

## 2018-01-19 DIAGNOSIS — F17290 Nicotine dependence, other tobacco product, uncomplicated: Secondary | ICD-10-CM | POA: Insufficient documentation

## 2018-01-19 DIAGNOSIS — F10920 Alcohol use, unspecified with intoxication, uncomplicated: Secondary | ICD-10-CM | POA: Insufficient documentation

## 2018-01-19 NOTE — ED Notes (Signed)
Pt BIB [facility]. Per medics, mother found patient confused, lethargic, and vomiting at home. She endorses drinking 4 cups of liquor today. She denies SI, HI.

## 2018-01-20 LAB — COMPREHENSIVE METABOLIC PANEL
ALT: 13 U/L — AB (ref 14–54)
AST: 28 U/L (ref 15–41)
Albumin: 3.7 g/dL (ref 3.5–5.0)
Alkaline Phosphatase: 51 U/L (ref 38–126)
Anion gap: 8 (ref 5–15)
BILIRUBIN TOTAL: 0.9 mg/dL (ref 0.3–1.2)
BUN: 11 mg/dL (ref 6–20)
CALCIUM: 8.7 mg/dL — AB (ref 8.9–10.3)
CHLORIDE: 110 mmol/L (ref 101–111)
CO2: 21 mmol/L — ABNORMAL LOW (ref 22–32)
CREATININE: 0.83 mg/dL (ref 0.44–1.00)
Glucose, Bld: 78 mg/dL (ref 65–99)
Potassium: 4.3 mmol/L (ref 3.5–5.1)
Sodium: 139 mmol/L (ref 135–145)
TOTAL PROTEIN: 6.7 g/dL (ref 6.5–8.1)

## 2018-01-20 LAB — I-STAT BETA HCG BLOOD, ED (MC, WL, AP ONLY)

## 2018-01-20 LAB — ETHANOL: Alcohol, Ethyl (B): 70 mg/dL — ABNORMAL HIGH (ref <10)

## 2018-01-20 LAB — CBC
HEMATOCRIT: 35.9 % — AB (ref 36.0–46.0)
Hemoglobin: 11.8 g/dL — ABNORMAL LOW (ref 12.0–15.0)
MCH: 26.9 pg (ref 26.0–34.0)
MCHC: 32.9 g/dL (ref 30.0–36.0)
MCV: 81.8 fL (ref 78.0–100.0)
PLATELETS: 622 10*3/uL — AB (ref 150–400)
RBC: 4.39 MIL/uL (ref 3.87–5.11)
RDW: 14.9 % (ref 11.5–15.5)
WBC: 13.2 10*3/uL — AB (ref 4.0–10.5)
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
## 2018-01-20 NOTE — Discharge Instructions (Signed)
Be careful when drinking, try not to drink to excess.

## 2019-02-15 ENCOUNTER — Encounter (HOSPITAL_COMMUNITY): Payer: Self-pay | Admitting: Emergency Medicine

## 2019-02-15 ENCOUNTER — Emergency Department (HOSPITAL_COMMUNITY)
Admission: EM | Admit: 2019-02-15 | Discharge: 2019-02-16 | Disposition: A | Discharge status: Home / Self Care | Payer: Self-pay | Attending: Emergency Medicine | Admitting: Emergency Medicine

## 2019-02-15 ENCOUNTER — Emergency Department (HOSPITAL_COMMUNITY): Payer: Self-pay

## 2019-02-15 DIAGNOSIS — R569 Unspecified convulsions: Secondary | ICD-10-CM | POA: Insufficient documentation

## 2019-02-15 DIAGNOSIS — F17210 Nicotine dependence, cigarettes, uncomplicated: Secondary | ICD-10-CM | POA: Insufficient documentation

## 2019-02-15 DIAGNOSIS — F1010 Alcohol abuse, uncomplicated: Secondary | ICD-10-CM | POA: Insufficient documentation

## 2019-02-15 DIAGNOSIS — R05 Cough: Secondary | ICD-10-CM | POA: Insufficient documentation

## 2019-02-15 LAB — COMPREHENSIVE METABOLIC PANEL
ALT: 18 U/L (ref 0–44)
AST: 26 U/L (ref 15–41)
Albumin: 3.5 g/dL (ref 3.5–5.0)
Alkaline Phosphatase: 58 U/L (ref 38–126)
Anion gap: 8 (ref 5–15)
BUN: 11 mg/dL (ref 6–20)
CO2: 20 mmol/L — ABNORMAL LOW (ref 22–32)
Calcium: 8.4 mg/dL — ABNORMAL LOW (ref 8.9–10.3)
Chloride: 104 mmol/L (ref 98–111)
Creatinine, Ser: 0.76 mg/dL (ref 0.44–1.00)
GFR calc non Af Amer: >60 mL/min (ref >60)
Glucose, Bld: 78 mg/dL (ref 70–99)
Potassium: 3.4 mmol/L — ABNORMAL LOW (ref 3.5–5.1)
Sodium: 132 mmol/L — ABNORMAL LOW (ref 135–145)
Total Bilirubin: 0.5 mg/dL (ref 0.3–1.2)
Total Protein: 6.4 g/dL — ABNORMAL LOW (ref 6.5–8.1)

## 2019-02-15 LAB — URINALYSIS, ROUTINE W REFLEX MICROSCOPIC
Bilirubin Urine: NEGATIVE
GLUCOSE, UA: NEGATIVE mg/dL
Hgb urine dipstick: NEGATIVE
Ketones, ur: NEGATIVE mg/dL
Leukocytes,Ua: NEGATIVE
Nitrite: NEGATIVE
PH: 6 (ref 5.0–8.0)
Protein, ur: NEGATIVE mg/dL
Specific Gravity, Urine: 1.013 (ref 1.005–1.030)

## 2019-02-15 LAB — CBC WITH DIFFERENTIAL/PLATELET
Abs Immature Granulocytes: 0.03 10*3/uL (ref 0.00–0.07)
Basophils Absolute: 0 10*3/uL (ref 0.0–0.1)
Basophils Relative: 0 %
Eosinophils Absolute: 0.1 10*3/uL (ref 0.0–0.5)
Eosinophils Relative: 1 %
HCT: 39.4 % (ref 36.0–46.0)
Hemoglobin: 12.2 g/dL (ref 12.0–15.0)
Immature Granulocytes: 0 %
Lymphocytes Relative: 20 %
Lymphs Abs: 1.5 10*3/uL (ref 0.7–4.0)
MCH: 25.4 pg — AB (ref 26.0–34.0)
MCHC: 31 g/dL (ref 30.0–36.0)
MCV: 81.9 fL (ref 80.0–100.0)
MONOS PCT: 7 %
Monocytes Absolute: 0.5 10*3/uL (ref 0.1–1.0)
Neutro Abs: 5.6 10*3/uL (ref 1.7–7.7)
Neutrophils Relative %: 72 %
Platelets: 237 10*3/uL (ref 150–400)
RBC: 4.81 MIL/uL (ref 3.87–5.11)
RDW: 14.6 % (ref 11.5–15.5)
WBC: 7.7 10*3/uL (ref 4.0–10.5)
nRBC: 0 % (ref 0.0–0.2)

## 2019-02-15 LAB — ETHANOL: Alcohol, Ethyl (B): <10 mg/dL (ref <10)

## 2019-02-15 LAB — I-STAT TROPONIN, ED: Troponin i, poc: 0 ng/mL (ref 0.00–0.08)

## 2019-02-15 LAB — I-STAT BETA HCG BLOOD, ED (MC, WL, AP ONLY)

## 2019-02-15 LAB — CBG MONITORING, ED: Glucose-Capillary: 73 mg/dL (ref 70–99)

## 2019-02-15 MED ORDER — IPRATROPIUM-ALBUTEROL 0.5-2.5 (3) MG/3ML IN SOLN
3.0000 mL | Freq: Once | RESPIRATORY_TRACT | Status: AC
  Administered 2019-02-15: 3 mL via RESPIRATORY_TRACT
  Filled 2019-02-15: qty 3

## 2019-02-15 MED ORDER — LORAZEPAM 2 MG/ML IJ SOLN
1.0000 mg | Freq: Once | INTRAMUSCULAR | Status: AC
  Administered 2019-02-15: 1 mg via INTRAVENOUS
  Filled 2019-02-15: qty 1

## 2019-02-15 MED ORDER — LACTATED RINGERS IV BOLUS
1000.0000 mL | Freq: Once | INTRAVENOUS | Status: AC
  Administered 2019-02-15: 1000 mL via INTRAVENOUS

## 2019-02-15 MED ORDER — CHLORDIAZEPOXIDE HCL 25 MG PO CAPS: ORAL_CAPSULE | ORAL | 0 refills | Status: DC
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
## 2019-02-15 NOTE — ED Notes (Signed)
istat trop ran in error,  I will have POC credit pt's account.
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
## 2019-02-16 ENCOUNTER — Other Ambulatory Visit: Payer: Self-pay

## 2019-02-16 ENCOUNTER — Encounter: Payer: Self-pay | Admitting: Neurology

## 2019-02-16 ENCOUNTER — Emergency Department (HOSPITAL_COMMUNITY)
Admission: EM | Admit: 2019-02-16 | Discharge: 2019-02-16 | Disposition: A | Discharge status: Home / Self Care | Payer: Self-pay | Attending: Emergency Medicine | Admitting: Emergency Medicine

## 2019-02-16 ENCOUNTER — Encounter (HOSPITAL_COMMUNITY): Payer: Self-pay | Admitting: Emergency Medicine

## 2019-02-16 DIAGNOSIS — Z5321 Procedure and treatment not carried out due to patient leaving prior to being seen by health care provider: Secondary | ICD-10-CM | POA: Insufficient documentation

## 2019-02-16 DIAGNOSIS — R5383 Other fatigue: Secondary | ICD-10-CM | POA: Insufficient documentation

## 2019-02-16 DIAGNOSIS — R51 Headache: Secondary | ICD-10-CM | POA: Insufficient documentation

## 2019-02-16 LAB — RAPID URINE DRUG SCREEN, HOSP PERFORMED
Amphetamines: NOT DETECTED
Barbiturates: NOT DETECTED
Benzodiazepines: NOT DETECTED
Cocaine: NOT DETECTED
Opiates: NOT DETECTED
Tetrahydrocannabinol: POSITIVE — AB

## 2019-02-16 NOTE — ED Notes (Addendum)
No Answer for room x2

## 2019-02-16 NOTE — ED Triage Notes (Signed)
Pt reports she was seen yesterday after a seizure and asthma/cough. Today she states that she feels generalized fatigue and is still coughing.

## 2019-02-16 NOTE — ED Triage Notes (Signed)
Pt also reporting headache

## 2019-02-16 NOTE — ED Notes (Signed)
Patient called no answer in waiting room.

## 2019-02-22 IMAGING — CR DG WRIST COMPLETE 3+V*R*
4 series · 4 of 4 positions shown · non-contrast
Comparison: 01/24/2017 right wrist radiograph

CLINICAL DATA: 21 y/o F; right hand pain, redness, and swelling
without trauma or injury.

EXAM:
RIGHT HAND - COMPLETE 3+ VIEW; RIGHT WRIST - COMPLETE 3+ VIEW

[wrist pa]
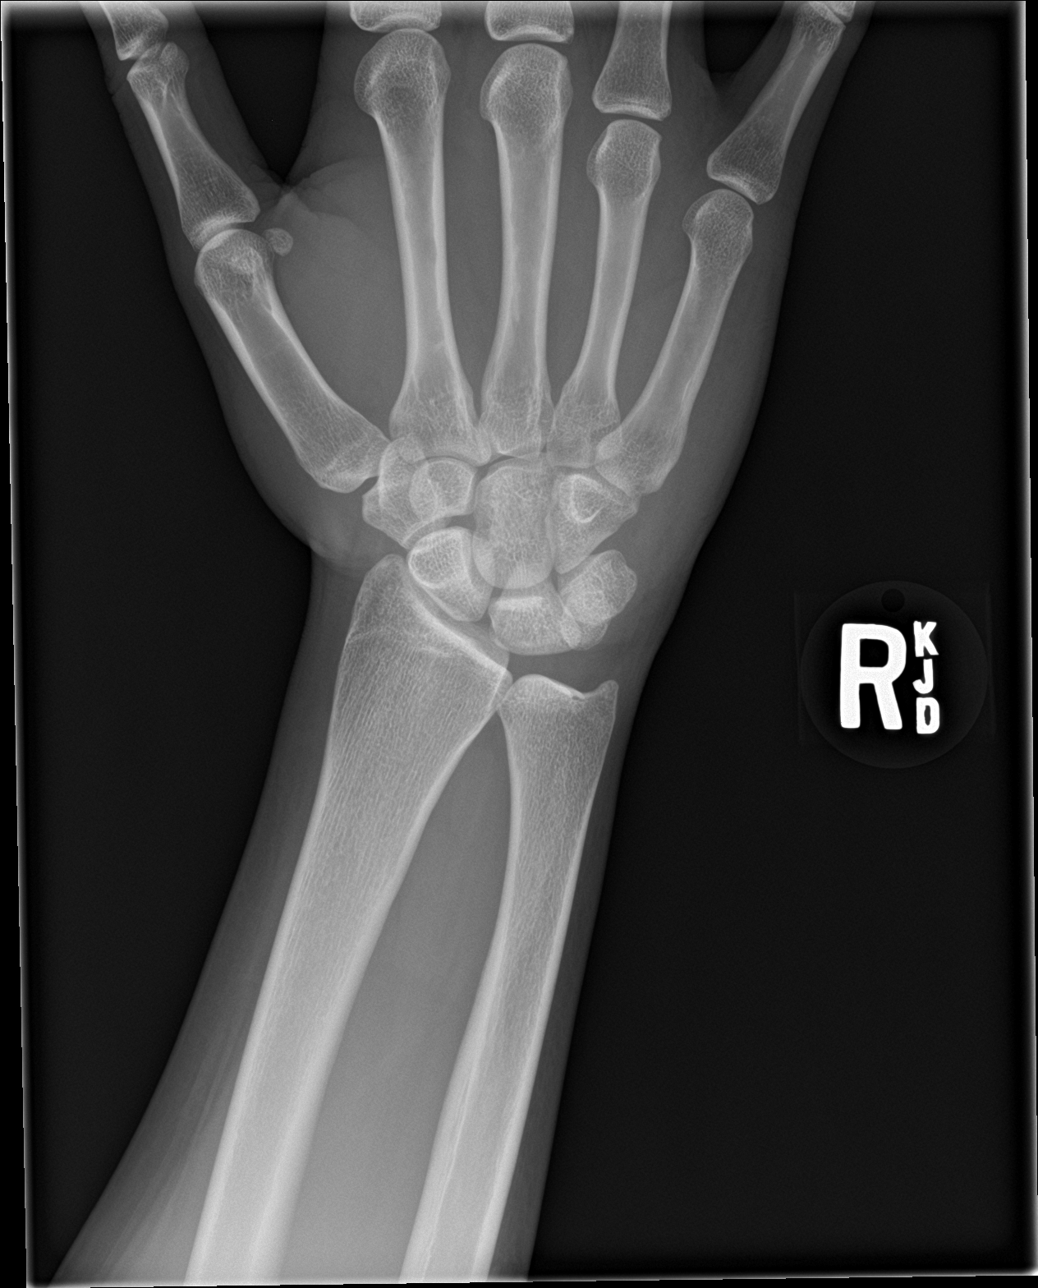

[wrist obl]
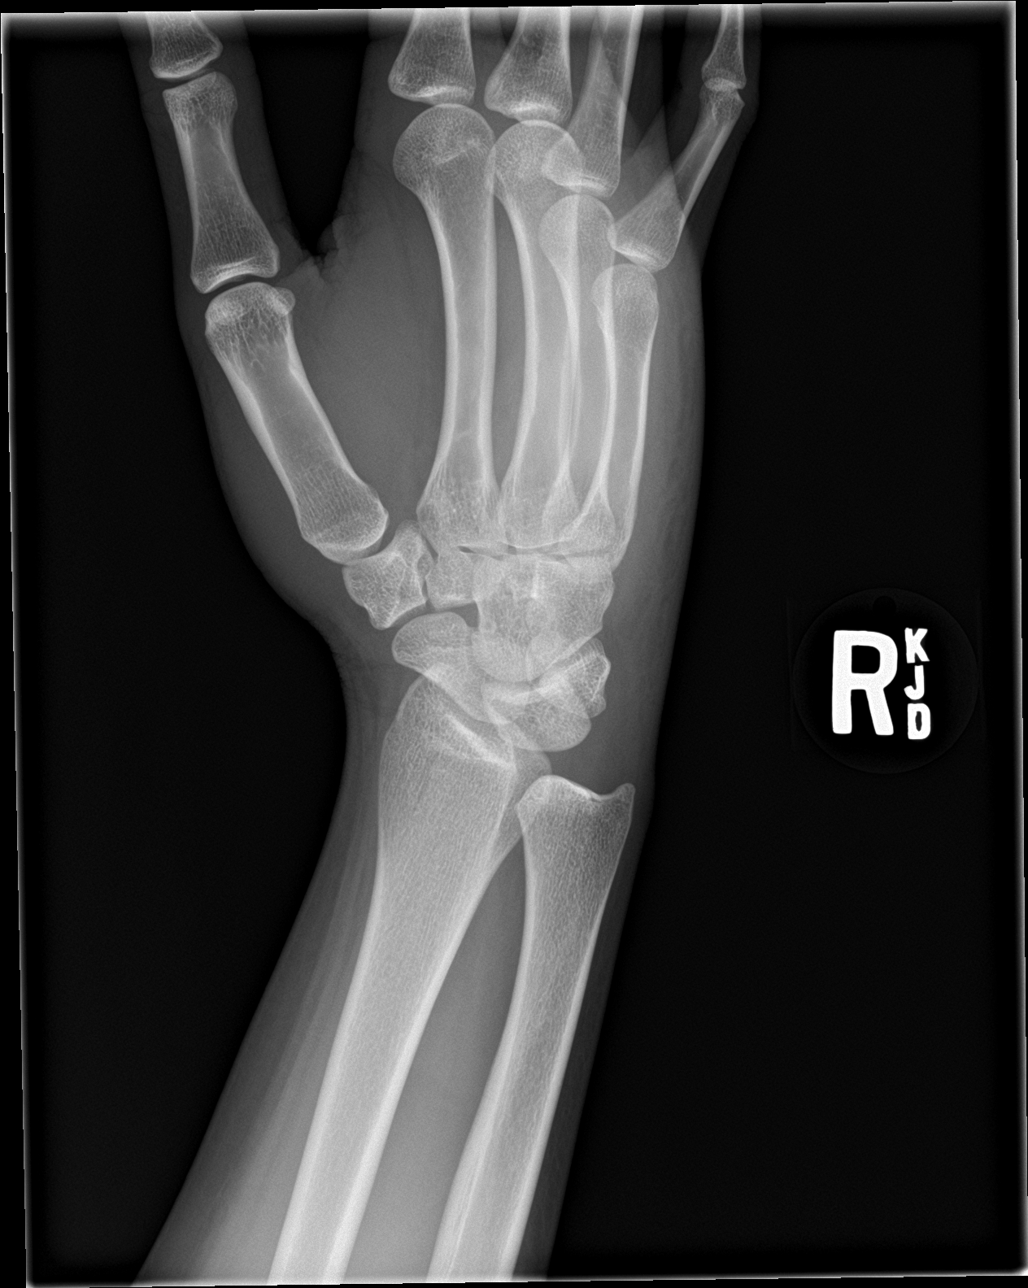

[wrist lat]
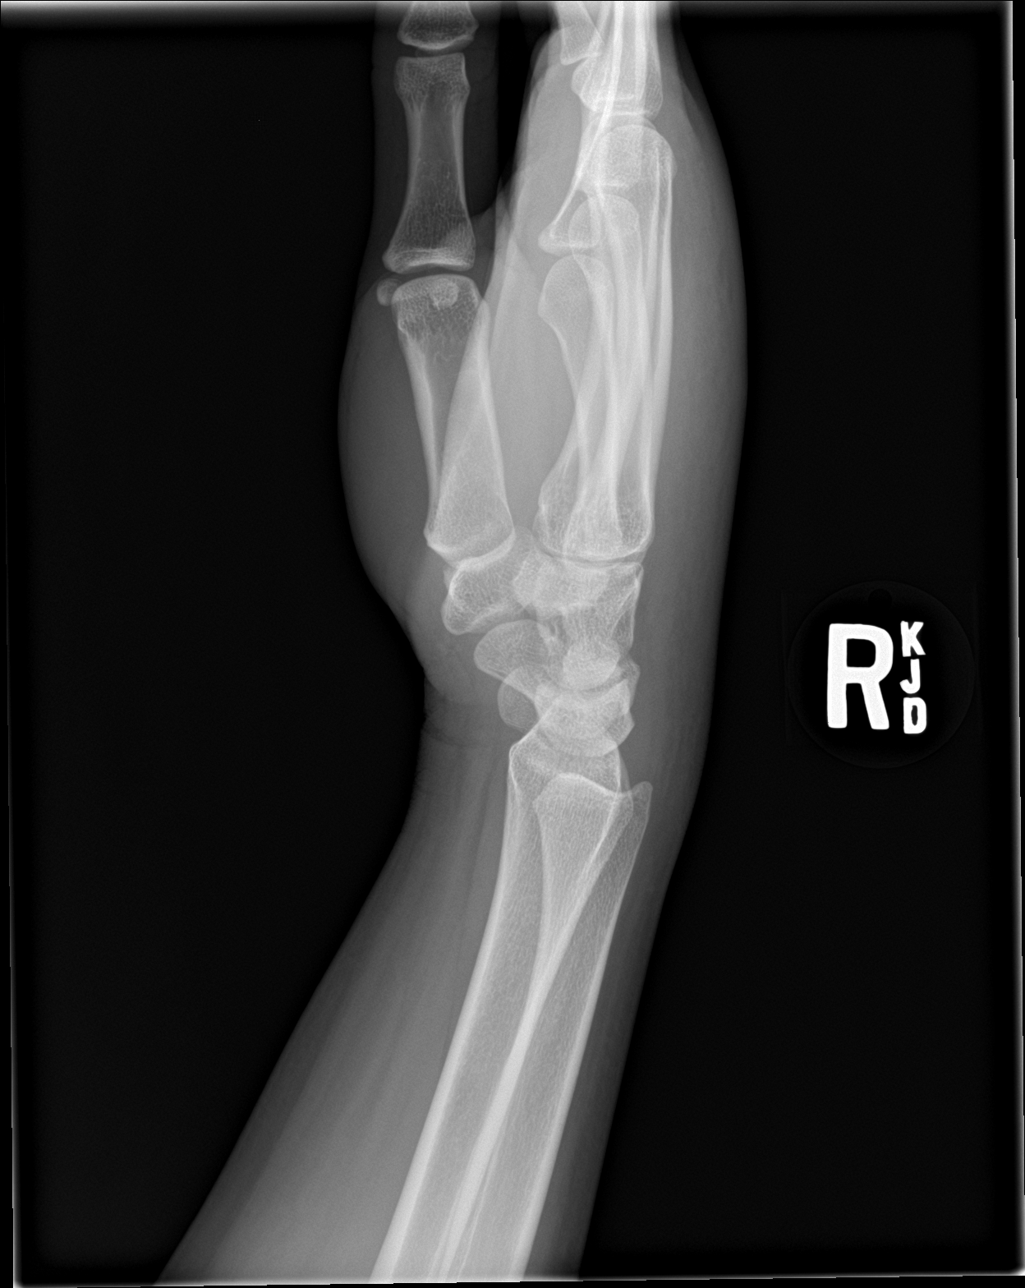

[wrist navicular]
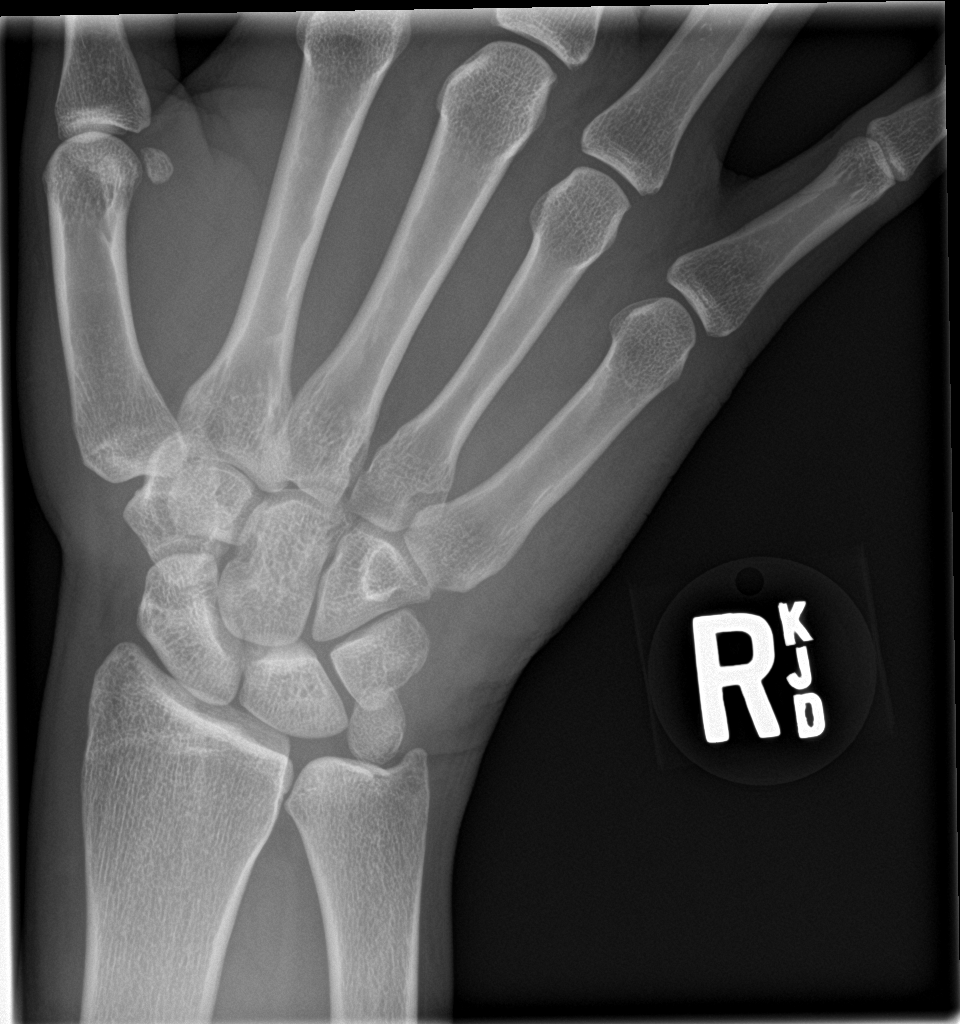

[4 of 4 positions shown; findings below may reference images not displayed]

FINDINGS: Right hand:

There is no evidence of fracture or dislocation. There is no
evidence of arthropathy or other focal bone abnormality. Soft
tissues are unremarkable.

Right wrist:

There is no evidence of fracture or dislocation. There is no
evidence of arthropathy or other focal bone abnormality. Soft
tissues are unremarkable.
IMPRESSION: Negative.

By: Sandhya Scholz M.D.

## 2019-02-28 ENCOUNTER — Other Ambulatory Visit: Payer: Self-pay

## 2019-02-28 ENCOUNTER — Encounter (HOSPITAL_COMMUNITY): Payer: Self-pay

## 2019-02-28 ENCOUNTER — Emergency Department (HOSPITAL_COMMUNITY)
Admission: EM | Admit: 2019-02-28 | Discharge: 2019-02-28 | Disposition: A | Discharge status: Home / Self Care | Payer: Medicaid Other | Attending: Emergency Medicine | Admitting: Emergency Medicine

## 2019-02-28 DIAGNOSIS — R059 Cough, unspecified: Secondary | ICD-10-CM

## 2019-02-28 DIAGNOSIS — J45909 Unspecified asthma, uncomplicated: Secondary | ICD-10-CM | POA: Insufficient documentation

## 2019-02-28 DIAGNOSIS — F17210 Nicotine dependence, cigarettes, uncomplicated: Secondary | ICD-10-CM | POA: Insufficient documentation

## 2019-02-28 DIAGNOSIS — Z79899 Other long term (current) drug therapy: Secondary | ICD-10-CM | POA: Insufficient documentation

## 2019-02-28 DIAGNOSIS — R05 Cough: Secondary | ICD-10-CM | POA: Insufficient documentation

## 2019-02-28 LAB — URINALYSIS, ROUTINE W REFLEX MICROSCOPIC
Bacteria, UA: NONE SEEN
Bilirubin Urine: NEGATIVE
Glucose, UA: NEGATIVE mg/dL
Ketones, ur: 20 mg/dL — AB
Leukocytes,Ua: NEGATIVE
Nitrite: NEGATIVE
Protein, ur: 30 mg/dL — AB
RBC / HPF: >50 RBC/hpf — ABNORMAL HIGH (ref 0–5)
Specific Gravity, Urine: 1.024 (ref 1.005–1.030)
pH: 6 (ref 5.0–8.0)

## 2019-02-28 LAB — PREGNANCY, URINE: Preg Test, Ur: NEGATIVE
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
## 2019-02-28 NOTE — ED Triage Notes (Signed)
Onset 2 days dysuria,blood in urine, frequent.  Pt endorses productive cough- green phlegm and subjective fever.  No resp or swallowing difficulties.

## 2019-02-28 NOTE — Discharge Instructions (Signed)
Urinalysis showed no signs of infection.  With your cough you may have coronavirus however cough is been ongoing long enough that less likely.  You do not have any fevers today.  However recommend that you continue self quarantine for an additional 7 days or 3 days after your fever resolves.  Return to ED if you have severe shortness of breath.   If you live with, or provide care at home for, a person confirmed to have, or being evaluated for, COVID-19 infection please follow these guidelines to prevent infection:  Follow healthcare providers instructions Make sure that you understand and can help the patient follow any healthcare provider instructions for all care.  Provide for the patients basic needs You should help the patient with basic needs in the home and provide support for getting groceries, prescriptions, and other personal needs.  Monitor the patients symptoms If they are getting sicker, call his or her medical provider a  This will help the healthcare providers office take steps to keep other people from getting infected. Ask the healthcare provider to call the local or state health department.  Limit the number of people who have contact with the patient If possible, have only one caregiver for the patient. Other household members should stay in another home or place of residence. If this is not possible, they should stay in another room, or be separated from the patient as much as possible. Use a separate bathroom, if available. Restrict visitors who do not have an essential need to be in the home.  Keep older adults, very young children, and other sick people away from the patient Keep older adults, very young children, and those who have compromised immune systems or chronic health conditions away from the patient. This includes people with chronic heart, lung, or kidney conditions, diabetes, and cancer.  Ensure good ventilation Make sure that shared spaces in the  home have good air flow, such as from an air conditioner or an opened window, weather permitting.  Wash your hands often Wash your hands often and thoroughly with soap and water for at least 20 seconds. You can use an alcohol based hand sanitizer if soap and water are not available and if your hands are not visibly dirty. Avoid touching your eyes, nose, and mouth with unwashed hands. Use disposable paper towels to dry your hands. If not available, use dedicated cloth towels and replace them when they become wet.  Wear a facemask and gloves Wear a disposable facemask at all times in the room and gloves when you touch or have contact with the patients blood, body fluids, and/or secretions or excretions, such as sweat, saliva, sputum, nasal mucus, vomit, urine, or feces.  Ensure the mask fits over your nose and mouth tightly, and do not touch it during use. Throw out disposable facemasks and gloves after using them. Do not reuse. Wash your hands immediately after removing your facemask and gloves. If your personal clothing becomes contaminated, carefully remove clothing and launder. Wash your hands after handling contaminated clothing. Place all used disposable facemasks, gloves, and other waste in a lined container before disposing them with other household waste. Remove gloves and wash your hands immediately after handling these items.  Do not share dishes, glasses, or other household items with the patient Avoid sharing household items. You should not share dishes, drinking glasses, cups, eating utensils, towels, bedding, or other items After the person uses these items, you should wash them thoroughly with soap and water.  Wash laundry thoroughly Immediately remove and wash clothes or bedding that have blood, body fluids, and/or secretions or excretions, such as sweat, saliva, sputum, nasal mucus, vomit, urine, or feces, on them. Wear gloves when handling laundry from the patient. Read and  follow directions on labels of laundry or clothing items and detergent. In general, wash and dry with the warmest temperatures recommended on the label.  Clean all areas the individual has used often Clean all touchable surfaces, such as counters, tabletops, doorknobs, bathroom fixtures, toilets, phones, keyboards, tablets, and bedside tables, every day. Also, clean any surfaces that may have blood, body fluids, and/or secretions or excretions on them. Wear gloves when cleaning surfaces the patient has come in contact with. Use a diluted bleach solution (e.g., dilute bleach with 1 part bleach and 10 parts water) or a household disinfectant with a label that says EPA-registered for coronaviruses. To make a bleach solution at home, add 1 tablespoon of bleach to 1 quart (4 cups) of water. For a larger supply, add  cup of bleach to 1 gallon (16 cups) of water. Read labels of cleaning products and follow recommendations provided on product labels. Labels contain instructions for safe and effective use of the cleaning product including precautions you should take when applying the product, such as wearing gloves or eye protection and making sure you have good ventilation during use of the product. Remove gloves and wash hands immediately after cleaning.  Monitor yourself for signs and symptoms of illness Caregivers and household members are considered close contacts, should monitor their health, and will be asked to limit movement outside of the home to the extent possible. Follow the monitoring steps for close contacts listed on the symptom monitoring form.   ? If you have additional questions, contact your local health department or call the epidemiologist on call at [phone] (available 24/7). ? This guidance is subject to change. For the most up-to-date guidance from [facility], please refer to their website: [email or web address]

## 2019-02-28 NOTE — ED Notes (Signed)
Discharge instructions discussed with Pt. Pt verbalized understanding. Pt stable and ambulatory.    

## 2019-03-02 ENCOUNTER — Ambulatory Visit: Payer: Self-pay | Admitting: Neurology

## 2019-05-07 ENCOUNTER — Ambulatory Visit: Payer: Self-pay | Admitting: Neurology
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
## 2019-08-01 ENCOUNTER — Emergency Department (HOSPITAL_COMMUNITY): Payer: HRSA Program

## 2019-08-01 ENCOUNTER — Emergency Department (HOSPITAL_COMMUNITY)
Admission: EM | Admit: 2019-08-01 | Discharge: 2019-08-02 | Disposition: A | Discharge status: Home / Self Care | Payer: HRSA Program | Attending: Emergency Medicine | Admitting: Emergency Medicine

## 2019-08-01 ENCOUNTER — Other Ambulatory Visit: Payer: Self-pay

## 2019-08-01 ENCOUNTER — Encounter (HOSPITAL_COMMUNITY): Payer: Self-pay | Admitting: Emergency Medicine

## 2019-08-01 DIAGNOSIS — J45909 Unspecified asthma, uncomplicated: Secondary | ICD-10-CM | POA: Insufficient documentation

## 2019-08-01 DIAGNOSIS — U071 COVID-19: Secondary | ICD-10-CM | POA: Insufficient documentation

## 2019-08-01 DIAGNOSIS — F17210 Nicotine dependence, cigarettes, uncomplicated: Secondary | ICD-10-CM | POA: Diagnosis not present

## 2019-08-01 DIAGNOSIS — N390 Urinary tract infection, site not specified: Secondary | ICD-10-CM | POA: Insufficient documentation

## 2019-08-01 DIAGNOSIS — R0602 Shortness of breath: Secondary | ICD-10-CM | POA: Diagnosis present

## 2019-08-01 LAB — URINALYSIS, ROUTINE W REFLEX MICROSCOPIC
Bilirubin Urine: NEGATIVE
Glucose, UA: NEGATIVE mg/dL
Ketones, ur: 20 mg/dL — AB
Nitrite: POSITIVE — AB
Protein, ur: 100 mg/dL — AB
Specific Gravity, Urine: 1.014 (ref 1.005–1.030)
WBC, UA: >50 WBC/hpf — ABNORMAL HIGH (ref 0–5)
pH: 6 (ref 5.0–8.0)

## 2019-08-01 LAB — CBC WITH DIFFERENTIAL/PLATELET
Abs Immature Granulocytes: 0.07 10*3/uL (ref 0.00–0.07)
Basophils Absolute: 0.1 10*3/uL (ref 0.0–0.1)
Basophils Relative: 1 %
Eosinophils Absolute: 0 10*3/uL (ref 0.0–0.5)
Eosinophils Relative: 0 %
HCT: 41.6 % (ref 36.0–46.0)
Hemoglobin: 13.7 g/dL (ref 12.0–15.0)
Immature Granulocytes: 1 %
Lymphocytes Relative: 12 %
Lymphs Abs: 1.5 10*3/uL (ref 0.7–4.0)
MCH: 26.2 pg (ref 26.0–34.0)
MCHC: 32.9 g/dL (ref 30.0–36.0)
MCV: 79.5 fL — ABNORMAL LOW (ref 80.0–100.0)
Monocytes Absolute: 1.7 10*3/uL — ABNORMAL HIGH (ref 0.1–1.0)
Monocytes Relative: 13 %
Neutro Abs: 9.9 10*3/uL — ABNORMAL HIGH (ref 1.7–7.7)
Neutrophils Relative %: 73 %
Platelets: 189 10*3/uL (ref 150–400)
RBC: 5.23 MIL/uL — ABNORMAL HIGH (ref 3.87–5.11)
RDW: 14.7 % (ref 11.5–15.5)
WBC: 13.3 10*3/uL — ABNORMAL HIGH (ref 4.0–10.5)
nRBC: 0 % (ref 0.0–0.2)

## 2019-08-01 LAB — COMPREHENSIVE METABOLIC PANEL
ALT: 23 U/L (ref 0–44)
AST: 25 U/L (ref 15–41)
Albumin: 3.9 g/dL (ref 3.5–5.0)
Alkaline Phosphatase: 59 U/L (ref 38–126)
Anion gap: 16 — ABNORMAL HIGH (ref 5–15)
BUN: 8 mg/dL (ref 6–20)
CO2: 21 mmol/L — ABNORMAL LOW (ref 22–32)
Calcium: 9.1 mg/dL (ref 8.9–10.3)
Chloride: 98 mmol/L (ref 98–111)
Creatinine, Ser: 1.1 mg/dL — ABNORMAL HIGH (ref 0.44–1.00)
GFR calc Af Amer: >60 mL/min (ref >60)
GFR calc non Af Amer: >60 mL/min (ref >60)
Glucose, Bld: 97 mg/dL (ref 70–99)
Potassium: 3.2 mmol/L — ABNORMAL LOW (ref 3.5–5.1)
Sodium: 135 mmol/L (ref 135–145)
Total Bilirubin: 0.5 mg/dL (ref 0.3–1.2)
Total Protein: 8.3 g/dL — ABNORMAL HIGH (ref 6.5–8.1)

## 2019-08-01 LAB — BLOOD GAS, VENOUS
Acid-Base Excess: 1.5 mmol/L (ref 0.0–2.0)
Bicarbonate: 23.1 mmol/L (ref 20.0–28.0)
FIO2: 21
O2 Saturation: 76.7 %
Patient temperature: 98.6
pCO2, Ven: 29 mmHg — ABNORMAL LOW (ref 44.0–60.0)
pH, Ven: 7.513 — ABNORMAL HIGH (ref 7.250–7.430)
pO2, Ven: 39.7 mmHg (ref 32.0–45.0)

## 2019-08-01 LAB — SARS CORONAVIRUS 2 BY RT PCR (HOSPITAL ORDER, PERFORMED IN ~~LOC~~ HOSPITAL LAB): SARS Coronavirus 2: POSITIVE — AB

## 2019-08-01 MED ORDER — NITROFURANTOIN MONOHYD MACRO 100 MG PO CAPS
100.0000 mg | ORAL_CAPSULE | Freq: Once | ORAL | Status: AC
  Administered 2019-08-01: 23:00:00 100 mg via ORAL
  Filled 2019-08-01: qty 1

## 2019-08-01 MED ORDER — METHYLPREDNISOLONE SODIUM SUCC 125 MG IJ SOLR
125.0000 mg | Freq: Once | INTRAMUSCULAR | Status: AC
  Administered 2019-08-01: 125 mg via INTRAVENOUS
  Filled 2019-08-01: qty 2

## 2019-08-01 MED ORDER — ALBUTEROL SULFATE HFA 108 (90 BASE) MCG/ACT IN AERS
8.0000 | INHALATION_SPRAY | Freq: Once | RESPIRATORY_TRACT | Status: AC
  Administered 2019-08-01: 8 via RESPIRATORY_TRACT
  Filled 2019-08-01: qty 6.7

## 2019-08-01 MED ORDER — SODIUM CHLORIDE 0.9 % IV BOLUS
500.0000 mL | Freq: Once | INTRAVENOUS | Status: AC
  Administered 2019-08-01: 500 mL via INTRAVENOUS

## 2019-08-01 MED ORDER — NITROFURANTOIN MONOHYD MACRO 100 MG PO CAPS: 100.0000 mg | ORAL_CAPSULE | Freq: Two times a day (BID) | ORAL | 0 refills | Status: DC

## 2019-08-01 MED ORDER — SODIUM CHLORIDE 0.9 % IV SOLN
INTRAVENOUS | Status: DC
  Administered 2019-08-01: 19:00:00 via INTRAVENOUS

## 2019-08-01 MED ORDER — ACETAMINOPHEN 325 MG PO TABS
650.0000 mg | ORAL_TABLET | Freq: Once | ORAL | Status: AC
  Administered 2019-08-01: 650 mg via ORAL
  Filled 2019-08-01: qty 2

## 2019-08-01 MED ORDER — MAGNESIUM SULFATE 2 GM/50ML IV SOLN
2.0000 g | Freq: Once | INTRAVENOUS | Status: AC
  Administered 2019-08-01: 2 g via INTRAVENOUS
  Filled 2019-08-01: qty 50
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
## 2019-08-01 NOTE — ED Triage Notes (Signed)
Pt c/o abd pains, SOB, fatigue, v/d for about 3 days.

## 2019-08-02 NOTE — Discharge Instructions (Addendum)
Testing today show that you have Covid-19 infection.  You are considered contagious and need to quarantine yourself, staying away from everybody else.  To treat this the best thing to do is drink plenty of fluids, eat regularly and take Tylenol if needed for fever.  Take Tylenol dose of 650 mg every 4 hours to treat the fever.  You also have a urinary tract infection, and we are prescribing an antibiotic called Macrobid for that.  Take the prescription to the pharmacy and start taking it tomorrow morning.  If you feel worse with trouble breathing, weakness, dizziness, or not getting better, you can return here for evaluation.  Do not go out into public, without a mask on.  Is best to stay away from all other people until your fever and symptoms have been gone for at least 72 hours.

## 2020-01-13 ENCOUNTER — Ambulatory Visit (INDEPENDENT_AMBULATORY_CARE_PROVIDER_SITE_OTHER): Payer: Self-pay

## 2020-01-13 ENCOUNTER — Other Ambulatory Visit: Payer: Self-pay

## 2020-01-13 ENCOUNTER — Ambulatory Visit (HOSPITAL_COMMUNITY)
Admission: EM | Admit: 2020-01-13 | Discharge: 2020-01-13 | Disposition: A | Discharge status: Home / Self Care | Payer: Self-pay | Attending: Family Medicine | Admitting: Family Medicine

## 2020-01-13 ENCOUNTER — Encounter (HOSPITAL_COMMUNITY): Payer: Self-pay | Admitting: Emergency Medicine

## 2020-01-13 DIAGNOSIS — M25562 Pain in left knee: Secondary | ICD-10-CM

## 2020-01-13 MED ORDER — TRAMADOL HCL 50 MG PO TABS: 50.0000 mg | ORAL_TABLET | Freq: Four times a day (QID) | ORAL | 0 refills | Status: DC | PRN

## 2020-01-13 MED ORDER — IBUPROFEN 800 MG PO TABS: 800.0000 mg | ORAL_TABLET | Freq: Three times a day (TID) | ORAL | 0 refills | Status: DC
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
## 2020-01-13 NOTE — Discharge Instructions (Signed)
Be aware, you have been prescribed pain medications that may cause drowsiness. Do not combine with alcohol or other illicit drugs. Please do not drive, operate heavy machinery, or take part in activities that require making important decisions while on this medication as your judgement may be clouded.  

## 2020-01-16 NOTE — ED Provider Notes (Signed)
[facility]   [id number] 01/13/20 Arrival Time: 1819  ASSESSMENT & PLAN:  1. Acute pain of left knee     I have personally viewed the imaging studies ordered this visit. No fx appreciated.  DG Knee Complete 4 Views Left  Result Date: 01/13/2020 CLINICAL DATA:  Left side knee pain.  Injury playing basketball. EXAM: LEFT KNEE - COMPLETE 4+ VIEW COMPARISON:  None. FINDINGS: No evidence of fracture, dislocation, or joint effusion. No evidence of arthropathy or other focal bone abnormality. Soft tissues are unremarkable. IMPRESSION: Negative. Electronically Signed   By: Charlett Nose M.D.   On: 01/13/2020 19:31    Significant discomfort. Placed in knee immobilizer + crutches.   Meds ordered this encounter  Medications  . traMADol (ULTRAM) 50 MG tablet    Sig: Take 1 tablet (50 mg total) by mouth every 6 (six) hours as needed.    Dispense:  15 tablet    Refill:  0  . ibuprofen (ADVIL) 800 MG tablet    Sig: Take 1 tablet (800 mg total) by mouth 3 (three) times daily with meals.    Dispense:  21 tablet    Refill:  0      Recommend: Follow-up Information    Schedule an appointment as soon as possible for a visit  with [facility].   Contact information: [address] [place] [address] [phone]          [place] Controlled Substances Registry consulted for this patient. I feel the risk/benefit ratio today is favorable for proceeding with this prescription for a controlled substance. Medication sedation precautions given.  Reviewed expectations re: course of current medical issues. Questions answered. Outlined signs and symptoms indicating need for more acute intervention. Patient verbalized understanding. After Visit Summary given.  SUBJECTIVE: History from: patient. Margaret Terry is a 24 y.o. female who reports persistent marked pain of her left knee; described as aching; without radiation. Onset: abrupt.  First noted: yesterday. Injury/trama: reports jumping, falling, landing on L knee; immediate pain; able to bear weight but with pain. Symptoms have gradually worsened since beginning. Aggravating factors: certain movements. Alleviating factors: have not been identified. Associated symptoms: none reported. Extremity sensation changes or weakness: none. Self treatment: has not tried OTC therapies.  History of similar: no.  History reviewed. No pertinent surgical history.    OBJECTIVE:  Vitals:   01/13/20 1917  BP: 116/68  Pulse: 73  Resp: 16  Temp: 98.7 F (37.1 C)  TempSrc: Oral  SpO2: 100%    General appearance: alert; no distress HEENT: [place]; AT Neck: supple with FROM Resp: unlabored respirations Extremities: . LLE: warm with well perfused appearance; poorly localized marked tenderness over left knee; without gross deformities; swelling: minimal; bruising: none; knee ROM: limited by reported pain CV: brisk extremity capillary refill of LLE; 2+ DP pulse of LLE. Skin: warm and dry; no visible rashes Neurologic: normal sensation of LLE Psychological: alert and cooperative; normal mood and affect     Allergies  Allergen Reactions  . Grass Extracts [Gramineae Pollens] Hives and Itching  . Penicillins     Unknown reaction. Has patient had a PCN reaction causing immediate rash, facial/tongue/throat swelling, SOB or lightheadedness with hypotension: Unknown Has patient had a PCN reaction causing severe rash involving mucus membranes or skin necrosis: Unknown Has patient had a PCN reaction that required hospitalization: Unknown Has patient had a PCN reaction occurring within the last 10 years: Unknown If all of  the above answers are "NO", then may proceed with Cephalosporin use.     Past Medical History:  Diagnosis Date  . Asthma   . Metabolic syndrome 25/63/8937   From old records   Social History   Socioeconomic History  . Marital status: Single    Spouse name:  Not on file  . Number of children: Not on file  . Years of education: Not on file  . Highest education level: Not on file  Occupational History  . Not on file  Tobacco Use  . Smoking status: Current Some Day Smoker    Types: Cigars  . Smokeless tobacco: Never Used  Substance and Sexual Activity  . Alcohol use: No  . Drug use: No  . Sexual activity: Not on file  Other Topics Concern  . Not on file  Social History Narrative   ** Merged History Encounter **       Social Determinants of Health   Financial Resource Strain:   . Difficulty of Paying Living Expenses: Not on file  Food Insecurity:   . Worried About Charity fundraiser in the Last Year: Not on file  . Ran Out of Food in the Last Year: Not on file  Transportation Needs:   . Lack of Transportation (Medical): Not on file  . Lack of Transportation (Non-Medical): Not on file  Physical Activity:   . Days of Exercise per Week: Not on file  . Minutes of Exercise per Session: Not on file  Stress:   . Feeling of Stress : Not on file  Social Connections:   . Frequency of Communication with Friends and Family: Not on file  . Frequency of Social Gatherings with Friends and Family: Not on file  . Attends Religious Services: Not on file  . Active Member of Clubs or Organizations: Not on file  . Attends Archivist Meetings: Not on file  . Marital Status: Not on file   Family History  Problem Relation Age of Onset  . Healthy Mother    History reviewed. No pertinent surgical history.    Vanessa Kick, MD 01/16/20 1019

## 2020-05-08 ENCOUNTER — Emergency Department (HOSPITAL_COMMUNITY)
Admission: EM | Admit: 2020-05-08 | Discharge: 2020-05-08 | Disposition: A | Discharge status: Home / Self Care | Payer: Medicaid Other | Attending: Emergency Medicine | Admitting: Emergency Medicine

## 2020-05-08 ENCOUNTER — Encounter (HOSPITAL_COMMUNITY): Payer: Self-pay

## 2020-05-08 DIAGNOSIS — Z5321 Procedure and treatment not carried out due to patient leaving prior to being seen by health care provider: Secondary | ICD-10-CM | POA: Insufficient documentation

## 2020-05-08 DIAGNOSIS — L989 Disorder of the skin and subcutaneous tissue, unspecified: Secondary | ICD-10-CM | POA: Insufficient documentation

## 2020-05-08 NOTE — ED Notes (Signed)
Called x 1 from lobby. No answer.  

## 2020-05-08 NOTE — ED Triage Notes (Signed)
Pt reports that her friend gave her a tattoo last week and today it is swollen and inflamed and the color has come out. No purulent drainage noted.

## 2020-05-28 ENCOUNTER — Other Ambulatory Visit: Payer: Self-pay

## 2020-05-28 ENCOUNTER — Ambulatory Visit (HOSPITAL_COMMUNITY)
Admission: EM | Admit: 2020-05-28 | Discharge: 2020-05-28 | Disposition: A | Discharge status: Home / Self Care | Payer: Medicaid Other | Attending: Physician Assistant | Admitting: Physician Assistant

## 2020-05-28 ENCOUNTER — Encounter (HOSPITAL_COMMUNITY): Payer: Self-pay

## 2020-05-28 DIAGNOSIS — J4521 Mild intermittent asthma with (acute) exacerbation: Secondary | ICD-10-CM

## 2020-05-28 MED ORDER — AEROCHAMBER PLUS FLO-VU LARGE MISC
Status: AC
  Filled 2020-05-28: qty 1

## 2020-05-28 MED ORDER — ALBUTEROL SULFATE HFA 108 (90 BASE) MCG/ACT IN AERS: 1.0000 | INHALATION_SPRAY | Freq: Four times a day (QID) | RESPIRATORY_TRACT | 3 refills | Status: DC | PRN

## 2020-05-28 MED ORDER — ALBUTEROL SULFATE HFA 108 (90 BASE) MCG/ACT IN AERS
INHALATION_SPRAY | RESPIRATORY_TRACT | Status: AC
  Filled 2020-05-28: qty 6.7

## 2020-05-28 MED ORDER — AEROCHAMBER PLUS FLO-VU MEDIUM MISC
1.0000 | Freq: Once | Status: AC
  Administered 2020-05-28: 1

## 2020-05-28 MED ORDER — ALBUTEROL SULFATE HFA 108 (90 BASE) MCG/ACT IN AERS
2.0000 | INHALATION_SPRAY | Freq: Once | RESPIRATORY_TRACT | Status: AC
  Administered 2020-05-28: 2 via RESPIRATORY_TRACT

## 2020-05-28 NOTE — ED Triage Notes (Signed)
Pt presents with asthma flare up (shortness of breath) X 3 days;  Pt states she does not have inhaler and nebulizer at home.
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
## 2020-05-28 NOTE — Discharge Instructions (Signed)
Keep the inhaler going 2 puffs every 6 hours while awake x 3 days, then use as needed. Start Xyzal daily as directed. It is over the counter. You should keep this going for allergies and help prevent these asthma flares. Stay hydrated. If these flares become more frequent then establish care with a PCP to help you. If you get worse, then please f/u.

## 2021-04-26 ENCOUNTER — Encounter (HOSPITAL_COMMUNITY): Payer: Self-pay

## 2021-04-26 ENCOUNTER — Emergency Department (HOSPITAL_COMMUNITY)
Admission: EM | Admit: 2021-04-26 | Discharge: 2021-04-26 | Disposition: A | Discharge status: Home / Self Care | Payer: Medicaid Other | Attending: Emergency Medicine | Admitting: Emergency Medicine

## 2021-04-26 ENCOUNTER — Other Ambulatory Visit: Payer: Self-pay

## 2021-04-26 DIAGNOSIS — N939 Abnormal uterine and vaginal bleeding, unspecified: Secondary | ICD-10-CM

## 2021-04-26 DIAGNOSIS — F17290 Nicotine dependence, other tobacco product, uncomplicated: Secondary | ICD-10-CM | POA: Insufficient documentation

## 2021-04-26 LAB — I-STAT CHEM 8, ED
BUN: 10 mg/dL (ref 6–20)
Calcium, Ion: 1.14 mmol/L — ABNORMAL LOW (ref 1.15–1.40)
Chloride: 105 mmol/L (ref 98–111)
Creatinine, Ser: 0.8 mg/dL (ref 0.44–1.00)
Glucose, Bld: 99 mg/dL (ref 70–99)
HCT: 43 % (ref 36.0–46.0)
Hemoglobin: 14.6 g/dL (ref 12.0–15.0)
Potassium: 3.8 mmol/L (ref 3.5–5.1)
Sodium: 139 mmol/L (ref 135–145)
TCO2: 25 mmol/L (ref 22–32)

## 2021-04-26 LAB — I-STAT BETA HCG BLOOD, ED (MC, WL, AP ONLY): I-stat hCG, quantitative: <5 m[IU]/mL (ref <5)

## 2021-04-26 MED ORDER — IBUPROFEN 800 MG PO TABS: 800.0000 mg | ORAL_TABLET | Freq: Three times a day (TID) | ORAL | 0 refills | Status: AC

## 2021-04-26 NOTE — Discharge Instructions (Signed)
Recommend trial of anti-inflammatories for the next week.  Recommend calling a gynecologist to discuss your symptoms, ideally you should be seen within the next week.  If you have an increase in your bleeding, you develop any pelvic pain, lower abdominal pain or other new concerning symptom, please return to ER for reassessment at that time.

## 2021-04-26 NOTE — ED Triage Notes (Signed)
Patient complains of irregular vaginal bleeding x 1 week with blood clots. Patient complains of lower back pain with same. Denies abd. pain

## 2021-04-26 NOTE — ED Provider Notes (Signed)
Emergency Medicine Provider Triage Evaluation Note  Margaret Terry , a 25 y.o. female  was evaluated in triage.  Pt complains of vaginal bleeding that started last week. States she has been passing clots as well. This is heavier than a normal period for her. She reports some back pain that is chronic. States she is concerned because she normally has her menstrual cycle when her sister does but this time she is not synced up with her sister  Review of Systems  Positive: Vaginal bleeding Negative: Pelvic pain  Physical Exam  BP 132/76 (BP Location: Right Arm)   Pulse 77   Temp 98.6 F (37 C)   Resp 18   SpO2 100%  Gen:   Awake, no distress   Resp:  Normal effort  MSK:   Moves extremities without difficulty   Medical Decision Making  Medically screening exam initiated at 4:11 PM.  Appropriate orders placed.  Margaret Terry was informed that the remainder of the evaluation will be completed by another provider, this initial triage assessment does not replace that evaluation, and the importance of remaining in the ED until their evaluation is complete.   Margaret Terry 04/26/21 1613    Lorre Nick, MD 04/28/21 [phone]
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
## 2021-09-17 ENCOUNTER — Emergency Department (HOSPITAL_COMMUNITY)
Admission: EM | Admit: 2021-09-17 | Discharge: 2021-09-17 | Disposition: A | Discharge status: Home / Self Care | Payer: Medicaid Other | Attending: Emergency Medicine | Admitting: Emergency Medicine

## 2021-09-17 ENCOUNTER — Other Ambulatory Visit: Payer: Self-pay

## 2021-09-17 ENCOUNTER — Encounter (HOSPITAL_COMMUNITY): Payer: Self-pay

## 2021-09-17 DIAGNOSIS — M791 Myalgia, unspecified site: Secondary | ICD-10-CM | POA: Insufficient documentation

## 2021-09-17 DIAGNOSIS — R112 Nausea with vomiting, unspecified: Secondary | ICD-10-CM | POA: Insufficient documentation

## 2021-09-17 DIAGNOSIS — F17290 Nicotine dependence, other tobacco product, uncomplicated: Secondary | ICD-10-CM | POA: Insufficient documentation

## 2021-09-17 DIAGNOSIS — R519 Headache, unspecified: Secondary | ICD-10-CM | POA: Insufficient documentation

## 2021-09-17 DIAGNOSIS — Z20822 Contact with and (suspected) exposure to covid-19: Secondary | ICD-10-CM | POA: Insufficient documentation

## 2021-09-17 DIAGNOSIS — F10120 Alcohol abuse with intoxication, uncomplicated: Secondary | ICD-10-CM

## 2021-09-17 DIAGNOSIS — F10129 Alcohol abuse with intoxication, unspecified: Secondary | ICD-10-CM | POA: Insufficient documentation

## 2021-09-17 DIAGNOSIS — J45909 Unspecified asthma, uncomplicated: Secondary | ICD-10-CM | POA: Insufficient documentation

## 2021-09-17 LAB — RESP PANEL BY RT-PCR (FLU A&B, COVID) ARPGX2
Influenza A by PCR: NEGATIVE
Influenza B by PCR: NEGATIVE
SARS Coronavirus 2 by RT PCR: NEGATIVE

## 2021-09-17 MED ORDER — SODIUM CHLORIDE 0.9 % IV BOLUS
1000.0000 mL | Freq: Once | INTRAVENOUS | Status: AC
  Administered 2021-09-17: 1000 mL via INTRAVENOUS

## 2021-09-17 MED ORDER — KETOROLAC TROMETHAMINE 30 MG/ML IJ SOLN
60.0000 mg | Freq: Once | INTRAMUSCULAR | Status: AC
  Administered 2021-09-17: 60 mg via INTRAVENOUS
  Filled 2021-09-17: qty 2

## 2021-09-17 NOTE — Discharge Instructions (Addendum)
I believe you have been experiencing a hangover this morning.  Your symptoms are also in line with COVID and the flu.  Your testing will not come back today however you will be able to see these results in your chart.  Continue to stay hydrated with water, Gatorade or Pedialyte.  Also make sure you are eating to increase your body's nutrients.  It was a pleasure to meet you and I hope that you feel better.
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
## 2021-09-17 NOTE — ED Triage Notes (Signed)
Pt BIBA from home. Pt c/o headache, chills, cough, and nausea. Pt was out drinking heavily last night.   VSS

## 2021-12-28 ENCOUNTER — Emergency Department (HOSPITAL_COMMUNITY): Payer: Medicaid Other

## 2021-12-28 ENCOUNTER — Emergency Department (HOSPITAL_COMMUNITY)
Admission: EM | Admit: 2021-12-28 | Discharge: 2021-12-28 | Disposition: A | Discharge status: Home / Self Care | Payer: Medicaid Other | Attending: Emergency Medicine | Admitting: Emergency Medicine

## 2021-12-28 ENCOUNTER — Encounter (HOSPITAL_COMMUNITY): Payer: Self-pay

## 2021-12-28 DIAGNOSIS — S82101A Unspecified fracture of upper end of right tibia, initial encounter for closed fracture: Secondary | ICD-10-CM | POA: Insufficient documentation

## 2021-12-28 DIAGNOSIS — Z79899 Other long term (current) drug therapy: Secondary | ICD-10-CM | POA: Insufficient documentation

## 2021-12-28 MED ORDER — OXYCODONE-ACETAMINOPHEN 5-325 MG PO TABS
2.0000 | ORAL_TABLET | Freq: Once | ORAL | Status: AC
  Administered 2021-12-28: 2 via ORAL
  Filled 2021-12-28: qty 2

## 2021-12-28 MED ORDER — KETOROLAC TROMETHAMINE 15 MG/ML IJ SOLN
15.0000 mg | Freq: Once | INTRAMUSCULAR | Status: AC
  Administered 2021-12-28: 15 mg via INTRAMUSCULAR
  Filled 2021-12-28: qty 1

## 2021-12-28 MED ORDER — OXYCODONE-ACETAMINOPHEN 5-325 MG PO TABS: 1.0000 | ORAL_TABLET | Freq: Four times a day (QID) | ORAL | 0 refills | Status: DC | PRN

## 2021-12-28 NOTE — ED Triage Notes (Signed)
Pt presents to ED from home with c/o right side leg and foot pain. Pt states she fell onto her leg tonight and is now unable to move the leg or stand on it. Pt reports pain is shooting down to her foot.
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
## 2021-12-28 NOTE — Discharge Instructions (Addendum)
Wear knee immobilizer at all times to stabilize your broken bone.  Do not put any weight on your right leg.  Use crutches to assist in mobility.  You may apply ice to your knee to help limit swelling and inflammation.  You have been prescribed Percocet to take for pain control.  Do not take Tylenol with this medication as there is already Tylenol in Percocet.  Also do not drive a motor vehicle, operate heavy machinery, or drink alcohol when taking Percocet as this medication may make you drowsy and impair your judgment.  Call the office of Dr. Yevette Edwards in the morning to schedule close follow-up with [facility] to further evaluate your broken bone.  Ideally, this follow-up should be within approximately 1 week.  Return to the emergency department for new or concerning symptoms.

## 2021-12-31 ENCOUNTER — Encounter (HOSPITAL_COMMUNITY): Payer: Self-pay | Admitting: Emergency Medicine

## 2021-12-31 ENCOUNTER — Emergency Department (EMERGENCY_DEPARTMENT_HOSPITAL): Payer: Self-pay

## 2021-12-31 ENCOUNTER — Emergency Department (HOSPITAL_COMMUNITY)
Admission: EM | Admit: 2021-12-31 | Discharge: 2021-12-31 | Disposition: A | Discharge status: Home / Self Care | Payer: Self-pay | Attending: Emergency Medicine | Admitting: Emergency Medicine

## 2021-12-31 ENCOUNTER — Other Ambulatory Visit: Payer: Self-pay

## 2021-12-31 ENCOUNTER — Emergency Department (HOSPITAL_COMMUNITY): Payer: Self-pay

## 2021-12-31 DIAGNOSIS — Z20822 Contact with and (suspected) exposure to covid-19: Secondary | ICD-10-CM | POA: Insufficient documentation

## 2021-12-31 DIAGNOSIS — R791 Abnormal coagulation profile: Secondary | ICD-10-CM | POA: Insufficient documentation

## 2021-12-31 DIAGNOSIS — S82101D Unspecified fracture of upper end of right tibia, subsequent encounter for closed fracture with routine healing: Secondary | ICD-10-CM | POA: Insufficient documentation

## 2021-12-31 DIAGNOSIS — R7309 Other abnormal glucose: Secondary | ICD-10-CM | POA: Insufficient documentation

## 2021-12-31 DIAGNOSIS — M79604 Pain in right leg: Secondary | ICD-10-CM

## 2021-12-31 DIAGNOSIS — M7989 Other specified soft tissue disorders: Secondary | ICD-10-CM

## 2021-12-31 DIAGNOSIS — W19XXXD Unspecified fall, subsequent encounter: Secondary | ICD-10-CM | POA: Insufficient documentation

## 2021-12-31 LAB — BASIC METABOLIC PANEL
Anion gap: 10 (ref 5–15)
BUN: 13 mg/dL (ref 6–20)
CO2: 23 mmol/L (ref 22–32)
Calcium: 8.9 mg/dL (ref 8.9–10.3)
Chloride: 101 mmol/L (ref 98–111)
Creatinine, Ser: 0.85 mg/dL (ref 0.44–1.00)
GFR, Estimated: >60 mL/min (ref >60)
Glucose, Bld: 132 mg/dL — ABNORMAL HIGH (ref 70–99)
Potassium: 3.7 mmol/L (ref 3.5–5.1)
Sodium: 134 mmol/L — ABNORMAL LOW (ref 135–145)

## 2021-12-31 LAB — CBC WITH DIFFERENTIAL/PLATELET
Abs Immature Granulocytes: 0.04 10*3/uL (ref 0.00–0.07)
Basophils Absolute: 0 10*3/uL (ref 0.0–0.1)
Basophils Relative: 0 %
Eosinophils Absolute: 0.1 10*3/uL (ref 0.0–0.5)
Eosinophils Relative: 1 %
HCT: 36.1 % (ref 36.0–46.0)
Hemoglobin: 11 g/dL — ABNORMAL LOW (ref 12.0–15.0)
Immature Granulocytes: 0 %
Lymphocytes Relative: 17 %
Lymphs Abs: 1.7 10*3/uL (ref 0.7–4.0)
MCH: 24 pg — ABNORMAL LOW (ref 26.0–34.0)
MCHC: 30.5 g/dL (ref 30.0–36.0)
MCV: 78.8 fL — ABNORMAL LOW (ref 80.0–100.0)
Monocytes Absolute: 0.5 10*3/uL (ref 0.1–1.0)
Monocytes Relative: 5 %
Neutro Abs: 7.3 10*3/uL (ref 1.7–7.7)
Neutrophils Relative %: 77 %
Platelets: 290 10*3/uL (ref 150–400)
RBC: 4.58 MIL/uL (ref 3.87–5.11)
RDW: 17.4 % — ABNORMAL HIGH (ref 11.5–15.5)
WBC: 9.7 10*3/uL (ref 4.0–10.5)
nRBC: 0 % (ref 0.0–0.2)

## 2021-12-31 LAB — I-STAT BETA HCG BLOOD, ED (MC, WL, AP ONLY): I-stat hCG, quantitative: <5 m[IU]/mL (ref <5)

## 2021-12-31 LAB — RESP PANEL BY RT-PCR (FLU A&B, COVID) ARPGX2
Influenza A by PCR: NEGATIVE
Influenza B by PCR: NEGATIVE
SARS Coronavirus 2 by RT PCR: NEGATIVE

## 2021-12-31 LAB — D-DIMER, QUANTITATIVE: D-Dimer, Quant: 1.73 ug/mL-FEU — ABNORMAL HIGH (ref 0.00–0.50)

## 2021-12-31 MED ORDER — MORPHINE SULFATE (PF) 4 MG/ML IV SOLN
4.0000 mg | Freq: Once | INTRAVENOUS | Status: AC
  Administered 2021-12-31: 4 mg via INTRAVENOUS
  Filled 2021-12-31: qty 1

## 2021-12-31 MED ORDER — OXYCODONE-ACETAMINOPHEN 10-325 MG PO TABS: 1.0000 | ORAL_TABLET | Freq: Four times a day (QID) | ORAL | 0 refills | Status: DC | PRN

## 2021-12-31 MED ORDER — IOHEXOL 350 MG/ML SOLN
80.0000 mL | Freq: Once | INTRAVENOUS | Status: AC | PRN
  Administered 2021-12-31: 80 mL via INTRAVENOUS

## 2021-12-31 NOTE — ED Notes (Signed)
Ortho at bedside.

## 2021-12-31 NOTE — ED Notes (Signed)
Ortho. At bedside.

## 2021-12-31 NOTE — ED Notes (Signed)
Patient transported to CT 

## 2021-12-31 NOTE — ED Notes (Signed)
Pt d/c home per MD order, Discharge summary reviewed, pt verbalizes understanding. Off unit via WC. No s/s of acute distress noted. Pt discharged home with girlfriend, who is discharge ride home.

## 2021-12-31 NOTE — Discharge Instructions (Signed)
Please keep the knee immobilizer on and use her crutches, nonweightbearing on your right leg.  Ice.  Elevation.  We are represcribing some narcotic pain medication.  Please contact Dr. Yevette Edwards tomorrow morning for close follow-up.  Return to the emergency department if any worsening or concerning symptoms

## 2021-12-31 NOTE — ED Provider Notes (Signed)
[facility]   CSN: [id number] Arrival date & time: 12/31/21  1715     History  Chief Complaint  Patient presents with   Leg Swelling    Margaret Terry is a 26 y.o. female.  She was here on 1/26 for right knee pain after a fall.  She was diagnosed with a proximal tibia fracture.  She was placed in a knee immobilizer and given crutches and oxycodone for pain.  She is complaining of uncontrolled pain of her right knee and leg, right leg swelling, feeling lightheaded and short of breath.  The history is provided by the patient.  Leg Pain Location:  Knee Time since incident:  3 days Injury: yes   Mechanism of injury: fall   Knee location:  R knee Pain details:    Quality:  Throbbing   Radiates to:  R leg   Severity:  Severe   Timing:  Constant   Progression:  Unchanged Chronicity:  New Relieved by:  Nothing Worsened by:  Bearing weight Ineffective treatments: narcotics. Associated symptoms: fatigue and swelling   Associated symptoms: no fever and no neck pain       Home Medications Prior to Admission medications   Medication Sig Start Date End Date Taking? Authorizing Provider  albuterol (VENTOLIN HFA) 108 (90 Base) MCG/ACT inhaler Inhale 1-2 puffs into the lungs every 6 (six) hours as needed for wheezing or shortness of breath. 05/28/20   Riki SheerYoung, Michelle G, PA-C  oxyCODONE-acetaminophen (PERCOCET/ROXICET) 5-325 MG tablet Take 1-2 tablets by mouth every 6 (six) hours as needed for severe pain. 12/28/21   Antony MaduraHumes, Kelly, PA-C      Allergies    Grass extracts [gramineae pollens] and Penicillins    Review of Systems   Review of Systems  Constitutional:  Positive for fatigue. Negative for fever.  HENT:  Negative for sore throat.   Respiratory:  Positive for shortness of breath.   Cardiovascular:  Negative for chest pain.  Gastrointestinal:  Negative for abdominal pain.  Genitourinary:  Negative for dysuria.   Musculoskeletal:  Negative for neck pain.  Skin:  Negative for rash.  Neurological:  Negative for headaches.   Physical Exam Updated Vital Signs BP 101/79 (BP Location: Left Arm)    Pulse 100    Temp 97.9 F (36.6 C)    Resp 15    LMP 12/24/2021 (Exact Date)    SpO2 100%  Physical Exam Vitals and nursing note reviewed.  Constitutional:      General: She is not in acute distress.    Appearance: Normal appearance. She is well-developed.  HENT:     Head: Normocephalic and atraumatic.  Eyes:     Conjunctiva/sclera: Conjunctivae normal.  Cardiovascular:     Rate and Rhythm: Normal rate and regular rhythm.     Heart sounds: No murmur heard. Pulmonary:     Effort: Pulmonary effort is normal. No respiratory distress.     Breath sounds: Normal breath sounds.  Abdominal:     Palpations: Abdomen is soft.     Tenderness: There is no abdominal tenderness. There is no guarding or rebound.  Musculoskeletal:        General: Swelling and tenderness present.     Cervical back: Neck supple.     Comments: She has diffusely tender knee and calf.  No cords appreciated.  No significant calf edema but does have moderate knee effusion.  Distal pulses intact.  Compartments soft no evidence of compartment syndrome  Skin:    General: Skin is warm and dry.     Capillary Refill: Capillary refill takes less than 2 seconds.  Neurological:     General: No focal deficit present.     Mental Status: She is alert.     Sensory: No sensory deficit.     Motor: No weakness.    ED Results / Procedures / Treatments   Labs (all labs ordered are listed, but only abnormal results are displayed) Labs Reviewed  BASIC METABOLIC PANEL - Abnormal; Notable for the following components:      Result Value   Sodium 134 (*)    Glucose, Bld 132 (*)    All other components within normal limits  CBC WITH DIFFERENTIAL/PLATELET - Abnormal; Notable for the following components:   Hemoglobin 11.0 (*)    MCV 78.8 (*)    MCH  24.0 (*)    RDW 17.4 (*)    All other components within normal limits  D-DIMER, QUANTITATIVE - Abnormal; Notable for the following components:   D-Dimer, Quant 1.73 (*)    All other components within normal limits  RESP PANEL BY RT-PCR (FLU A&B, COVID) [id number]  I-STAT BETA HCG BLOOD, ED ([facility], [facility], [facility] ONLY)    EKG EKG Interpretation  Date/Time:  Sunday December 31 2021 18:40:32 EST Ventricular Rate:  93 PR Interval:  161 QRS Duration: 80 QT Interval:  330 QTC Calculation: 411 R Axis:   45 Text Interpretation: Sinus rhythm Borderline T abnormalities, inferior leads No significant change since prior 8/20 Confirmed by Meridee Score [phone]) on 12/31/2021 7:00:52 PM  Radiology CT Angio Chest PE W/Cm &/Or Wo Cm  Result Date: 12/31/2021 CLINICAL DATA:  Elevated D-dimer. Chest pain. Recent right leg fracture. Clinical suspicion for pulmonary embolism. EXAM: CT ANGIOGRAPHY CHEST WITH CONTRAST TECHNIQUE: Multidetector CT imaging of the chest was performed using the standard protocol during bolus administration of intravenous contrast. Multiplanar CT image reconstructions and MIPs were obtained to evaluate the vascular anatomy. RADIATION DOSE REDUCTION: This exam was performed according to the departmental dose-optimization program which includes automated exposure control, adjustment of the mA and/or kV according to patient size and/or use of iterative reconstruction technique. CONTRAST:  68mL OMNIPAQUE IOHEXOL 350 MG/ML SOLN COMPARISON:  01/05/2018 FINDINGS: Cardiovascular: Satisfactory opacification of pulmonary arteries noted, and no pulmonary emboli identified. No evidence of thoracic aortic dissection or aneurysm. Mediastinum/Nodes: No masses or pathologically enlarged lymph nodes identified. Lungs/Pleura: No pulmonary mass, infiltrate, or effusion. Upper abdomen: No acute findings. Musculoskeletal: No suspicious bone lesions identified. Review of the MIP images confirms the above findings.  IMPRESSION: Negative. No evidence of pulmonary embolism or other active disease. Electronically Signed   By: Danae Orleans M.D.   On: 12/31/2021 20:20   VAS [place] LOWER EXTREMITY VENOUS (DVT) (7a-7p)  Result Date: 12/31/2021  Lower Venous DVT Study Patient Name:  Margaret Terry  Date of Exam:   12/31/2021 Medical Rec #: [id number]        Accession #:    [id number] Date of Birth: 1996-06-27        Patient Gender: F Patient Age:   41 years Exam Location:  [facility] Procedure:      VAS [place] LOWER EXTREMITY VENOUS (DVT) Referring Phys: Danial Sisley --------------------------------------------------------------------------------  Indications: Pain, and Swelling. Other Indications: Recent leg injury w/ fx. Comparison Study: No previous exams Performing Technologist: Jody Hill RVT, RDMS  Examination Guidelines: A complete evaluation includes B-mode imaging, spectral Doppler, color Doppler, and power Doppler as needed of  all accessible portions of each vessel. Bilateral testing is considered an integral part of a complete examination. Limited examinations for reoccurring indications may be performed as noted. The reflux portion of the exam is performed with the patient in reverse Trendelenburg.  +---------+---------------+---------+-----------+----------+--------------+  RIGHT     Compressibility Phasicity Spontaneity Properties Thrombus Aging  +---------+---------------+---------+-----------+----------+--------------+  CFV       Full            Yes       Yes                                    +---------+---------------+---------+-----------+----------+--------------+  SFJ       Full                                                             +---------+---------------+---------+-----------+----------+--------------+  FV Prox   Full            Yes       Yes                                    +---------+---------------+---------+-----------+----------+--------------+  FV Mid    Full            Yes       Yes                                     +---------+---------------+---------+-----------+----------+--------------+  FV Distal Full            Yes       Yes                                    +---------+---------------+---------+-----------+----------+--------------+  PFV       Full                                                             +---------+---------------+---------+-----------+----------+--------------+  POP       Full            Yes       Yes                                    +---------+---------------+---------+-----------+----------+--------------+  PTV       Full                                                             +---------+---------------+---------+-----------+----------+--------------+  PERO      Full                                                             +---------+---------------+---------+-----------+----------+--------------+   +----+---------------+---------+-----------+----------+--------------+  LEFT Compressibility Phasicity Spontaneity Properties Thrombus Aging  +----+---------------+---------+-----------+----------+--------------+  CFV  Full            Yes       Yes                                    +----+---------------+---------+-----------+----------+--------------+     Summary: RIGHT: - There is no evidence of deep vein thrombosis in the lower extremity. - There is no evidence of superficial venous thrombosis.  - No cystic structure found in the popliteal fossa.  LEFT: - No evidence of common femoral vein obstruction.  *See table(s) above for measurements and observations.    Preliminary     Procedures Procedures    Medications Ordered in ED Medications  morphine 4 MG/ML injection 4 mg (4 mg Intravenous Given 12/31/21 1827)  iohexol (OMNIPAQUE) 350 MG/ML injection 80 mL (80 mLs Intravenous Contrast Given 12/31/21 2010)  morphine 4 MG/ML injection 4 mg (4 mg Intravenous Given 12/31/21 2029)    ED Course/ Medical Decision Making/ A&P Clinical Course as of 12/31/21 2129   Wynelle Link Dec 31, 2021  1757 CT right knee from 12/28/2021 - IMPRESSION: 1. Acute fracture of the lateral aspect of the right proximal tibia which has both an oblique predominantly nondisplaced component, and a mildly comminuted and impacted component involving the anterior aspect of the lateral tibial plateau, as detailed above. [MB]  1915 Duplex was prematurely read as no evidence of DVT.  D-dimer markedly elevated so we will put in for chest CT. [MB]  2023 CT angio chest shows no evidence of PE.  Orthotec here to reapply knee immobilizer. [MB]    Clinical Course User Index [MB] Terrilee Files, MD                           Medical Decision Making Amount and/or Complexity of Data Reviewed Labs: ordered. Radiology: ordered. ECG/medicine tests: ordered.  Risk Prescription drug management.  This patient complains of worsening leg pain and leg pain, feeling hot and cold, short of breath; this involves an extensive number of treatment Options and is a complaint that carries with it a high risk of complications and Morbidity. The differential includes pain control, DVT, compartment syndrome, PE metabolic derangement, ischemia  I ordered, reviewed and interpreted labs, which included CBC with normal white count, hemoglobin low, has been low before but not recently platelets normal, chemistries normal other than elevated glucose, pregnancy test negative, D-dimer elevated covid and flu test negative I ordered medication IV pain medication with provement of her pain level I ordered imaging studies which included duplex right lower extremity and CT angio chest and I independently    visualized and interpreted imaging which showed no evidence of DVT no PE Additional history obtained from patient's companion Previous records obtained and reviewed her ED visit where she had a nondisplaced tibial plateau fracture  After the interventions stated above, I reevaluated the patient and found patient be  neurologically intact controlled.  She is asking for higher level of pain medication to be prescribed.  She is placed back into a knee immobilizer at appropriate position.  Recommended close follow-up with orthopedics.  Return instructions discussed          Final Clinical Impression(s) / ED Diagnoses Final diagnoses:  Closed fracture of proximal end of right tibia with routine healing, unspecified fracture morphology, subsequent  encounter    Rx / DC Orders ED Discharge Orders     None         Terrilee Files, MD 12/31/21 2133

## 2021-12-31 NOTE — ED Triage Notes (Signed)
Pt states she broke R tibia 4 days ago.  Reports increased swelling to leg.  Pain is controlled with Percocet.

## 2021-12-31 NOTE — Progress Notes (Signed)
Orthopedic Tech Progress Note Patient Details:  Margaret Terry 1996/03/07 [id number]  Ortho Devices Type of Ortho Device: Knee Immobilizer Ortho Device/Splint Location: rle. Ortho Device/Splint Interventions: Ordered, Adjustment, Application  I reapplied the knee immobilizer to the patient. The brace was missing one of the side stays so I took one from a new brace and applied it to the brace. It fit well now. The patient stated that the way it was on was the way [facility] staff applied it. Post Interventions Patient Tolerated: Well Instructions Provided: Care of device, Adjustment of device  Karolee Stamps 12/31/2021, 8:43 PM

## 2021-12-31 NOTE — Progress Notes (Signed)
RLE venous duplex has been completed.  Preliminary results given to Dr. Charm Barges.  Results can be found under chart review under CV PROC. 12/31/2021 7:15 PM Vivica Dobosz RVT, RDMS

## 2022-01-18 ENCOUNTER — Ambulatory Visit (HOSPITAL_COMMUNITY)
Admission: EM | Admit: 2022-01-18 | Discharge: 2022-01-18 | Disposition: A | Discharge status: Home / Self Care | Payer: Medicaid Other | Attending: Family Medicine | Admitting: Family Medicine

## 2022-01-18 ENCOUNTER — Encounter (HOSPITAL_COMMUNITY): Payer: Self-pay

## 2022-01-18 ENCOUNTER — Other Ambulatory Visit: Payer: Self-pay

## 2022-01-18 DIAGNOSIS — M79604 Pain in right leg: Secondary | ICD-10-CM

## 2022-01-18 DIAGNOSIS — S82101A Unspecified fracture of upper end of right tibia, initial encounter for closed fracture: Secondary | ICD-10-CM

## 2022-01-18 MED ORDER — IBUPROFEN 800 MG PO TABS: 800.0000 mg | ORAL_TABLET | Freq: Three times a day (TID) | ORAL | 0 refills | Status: DC

## 2022-01-18 MED ORDER — OXYCODONE-ACETAMINOPHEN 5-325 MG PO TABS: 1.0000 | ORAL_TABLET | Freq: Four times a day (QID) | ORAL | 0 refills | Status: DC | PRN

## 2022-01-18 NOTE — ED Triage Notes (Signed)
Pt presents for medication refill  States she was taking Oxycodone.

## 2022-01-18 NOTE — ED Provider Notes (Signed)
[facility]   [id number] 01/18/22 Arrival Time: 1311  ASSESSMENT & PLAN:  1. Closed fracture of proximal end of right tibia, unspecified fracture morphology, initial encounter   2. Right leg pain    No indication to image leg today. Has reported f/u with orthopaedist. Reports being told she needs surgery.  Discharge Medication List as of 01/18/2022  3:14 PM     START taking these medications   Details  ibuprofen (ADVIL) 800 MG tablet Take 1 tablet (800 mg total) by mouth 3 (three) times daily with meals., Starting Thu 01/18/2022, Normal    oxyCODONE-acetaminophen (PERCOCET/ROXICET) 5-325 MG tablet Take 1 tablet by mouth every 6 (six) hours as needed for severe pain., Starting Thu 01/18/2022, Normal          Discharge Instructions      Keep your follow up with the orthopaedic surgeon. They should be able to manage your pain going forward.  Be aware, you have been prescribed pain medications that may cause drowsiness. While taking this medication, do not take any other medications containing acetaminophen (Tylenol). Do not combine with alcohol or other illicit drugs. Please do not drive, operate heavy machinery, or take part in activities that require making important decisions while on this medication as your judgement may be clouded.     [place] Controlled Substances Registry consulted for this patient. I feel the risk/benefit ratio today is favorable for proceeding with this prescription for a controlled substance. Medication sedation precautions given.   Recommend:  Follow-up Information     [facility] at [facility] .   Specialty: Urgent Care Why: As needed. Contact information: [address] [place] [address] [phone]                 Reviewed expectations re: course of current medical issues. Questions answered. Outlined signs and symptoms indicating need for more acute intervention. Patient verbalized  understanding. After Visit Summary given.  SUBJECTIVE: History from: patient. Margaret Terry is a 26 y.o. female who reports begin dx with prox tib fracture; right; on 12/28/21. Has seen surgeon; told should have surgery. Continuing pain; interfering with sleep. Not wearing knee immobilizer secondary to reported posterior knee irritation when she wears. No extremity sensation changes or weakness. OTC ibuprofen not helmping.  History reviewed. No pertinent surgical history.    OBJECTIVE:  Vitals:   01/18/22 1449  BP: 121/73  Pulse: 98  Resp: 17  Temp: 98.1 F (36.7 C)  TempSrc: Oral  SpO2: 99%    General appearance: alert; no distress HEENT: [place]; AT Neck: supple with FROM Resp: unlabored respirations Extremities: RLE: warm with well perfused appearance; poorly localized moderate tenderness over right leg just below knee; without gross deformities; swelling: none; bruising: none; knee ROM: normal CV: brisk extremity capillary refill of RLE; 2+ DP pulse of RLE. Skin: warm and dry; no visible rashes Neurologic: normal sensation and strength of RLE Psychological: alert and cooperative; normal mood and affect  Imaging Reviewed: DG Tibia/Fibula Right  Result Date: 12/28/2021 CLINICAL DATA:  Larey Seat and twisted her leg, cannot bear weight EXAM: RIGHT TIBIA AND FIBULA - 2 VIEW; RIGHT FOOT COMPLETE - 3+ VIEW COMPARISON:  None. FINDINGS: There is no evidence of fracture or other focal bone lesions in the right tibia or fibula. Soft tissues are unremarkable. There is no evidence of fracture or dislocation in the right foot. There is no evidence of arthropathy or other focal bone abnormality. Soft tissues are unremarkable. IMPRESSION: No acute  osseous abnormality. Electronically Signed   By: Wiliam Ke M.D.   On: 12/28/2021 02:45   CT Angio Chest PE W/Cm &/Or Wo Cm  Result Date: 12/31/2021 CLINICAL DATA:  Elevated D-dimer. Chest pain. Recent right leg fracture. Clinical suspicion for  pulmonary embolism. EXAM: CT ANGIOGRAPHY CHEST WITH CONTRAST TECHNIQUE: Multidetector CT imaging of the chest was performed using the standard protocol during bolus administration of intravenous contrast. Multiplanar CT image reconstructions and MIPs were obtained to evaluate the vascular anatomy. RADIATION DOSE REDUCTION: This exam was performed according to the departmental dose-optimization program which includes automated exposure control, adjustment of the mA and/or kV according to patient size and/or use of iterative reconstruction technique. CONTRAST:  67mL OMNIPAQUE IOHEXOL 350 MG/ML SOLN COMPARISON:  01/05/2018 FINDINGS: Cardiovascular: Satisfactory opacification of pulmonary arteries noted, and no pulmonary emboli identified. No evidence of thoracic aortic dissection or aneurysm. Mediastinum/Nodes: No masses or pathologically enlarged lymph nodes identified. Lungs/Pleura: No pulmonary mass, infiltrate, or effusion. Upper abdomen: No acute findings. Musculoskeletal: No suspicious bone lesions identified. Review of the MIP images confirms the above findings. IMPRESSION: Negative. No evidence of pulmonary embolism or other active disease. Electronically Signed   By: Danae Orleans M.D.   On: 12/31/2021 20:20   CT Knee Right Wo Contrast  Result Date: 12/28/2021 CLINICAL DATA:  26 year old female with history of trauma from a fall onto the right leg complaining of knee pain. Suspected fracture. EXAM: CT OF THE RIGHT KNEE WITHOUT CONTRAST TECHNIQUE: Multidetector CT imaging of the right knee was performed according to the standard protocol. Multiplanar CT image reconstructions were also generated. RADIATION DOSE REDUCTION: This exam was performed according to the departmental dose-optimization program which includes automated exposure control, adjustment of the mA and/or kV according to patient size and/or use of iterative reconstruction technique. COMPARISON:  None. FINDINGS: Bones/Joint/Cartilage Oblique  fracture through the lateral aspect of the proximal tibia involving the lateral aspect of the tibial plateau, the tibial eminence and extending through the lateral aspect of there is also a mildly impacted and comminuted fracture of the anterior aspect of the lateral tibial plateau. Some small loose fragments of the tibial eminence are present in the joint space. Metadiaphyseal region. This oblique fracture is predominantly nondisplaced, however, the distal femur, patella and visualized portions of the proximal fibula are intact. Ligaments Suboptimally assessed by CT. Muscles and Tendons Grossly unremarkable by CT. Soft tissues Small suprapatellar effusion. Obscuration of the fat planes in the lateral aspect of the proximal right leg, presumably secondary to a small amount of intramuscular hemorrhage. IMPRESSION: 1. Acute fracture of the lateral aspect of the right proximal tibia which has both an oblique predominantly nondisplaced component, and a mildly comminuted and impacted component involving the anterior aspect of the lateral tibial plateau, as detailed above. Electronically Signed   By: Trudie Reed M.D.   On: 12/28/2021 05:24   DG Foot Complete Right  Result Date: 12/28/2021 CLINICAL DATA:  Larey Seat and twisted her leg, cannot bear weight EXAM: RIGHT TIBIA AND FIBULA - 2 VIEW; RIGHT FOOT COMPLETE - 3+ VIEW COMPARISON:  None. FINDINGS: There is no evidence of fracture or other focal bone lesions in the right tibia or fibula. Soft tissues are unremarkable. There is no evidence of fracture or dislocation in the right foot. There is no evidence of arthropathy or other focal bone abnormality. Soft tissues are unremarkable. IMPRESSION: No acute osseous abnormality. Electronically Signed   By: Wiliam Ke M.D.   On: 12/28/2021 02:45   DG  Femur Min 2 Views Right  Result Date: 12/28/2021 CLINICAL DATA:  Larey Seat and twisted leg EXAM: RIGHT FEMUR 2 VIEWS COMPARISON:  None. FINDINGS: There is no evidence of  fracture or other focal bone lesions. Soft tissues are unremarkable. IMPRESSION: Negative. Electronically Signed   By: Wiliam Ke M.D.   On: 12/28/2021 03:28   VAS [place] LOWER EXTREMITY VENOUS (DVT) (7a-7p)  Result Date: 01/01/2022  Lower Venous DVT Study Patient Name:  Margaret Terry  Date of Exam:   12/31/2021 Medical Rec #: [id number]        Accession #:    [id number] Date of Birth: 1996-03-03        Patient Gender: F Patient Age:   42 years Exam Location:  [facility] Procedure:      VAS [place] LOWER EXTREMITY VENOUS (DVT) Referring Phys: MICHAEL BUTLER --------------------------------------------------------------------------------  Indications: Pain, and Swelling. Other Indications: Recent leg injury w/ fx. Comparison Study: No previous exams Performing Technologist: Jody Hill RVT, RDMS  Examination Guidelines: A complete evaluation includes B-mode imaging, spectral Doppler, color Doppler, and power Doppler as needed of all accessible portions of each vessel. Bilateral testing is considered an integral part of a complete examination. Limited examinations for reoccurring indications may be performed as noted. The reflux portion of the exam is performed with the patient in reverse Trendelenburg.  +---------+---------------+---------+-----------+----------+--------------+  RIGHT     Compressibility Phasicity Spontaneity Properties Thrombus Aging  +---------+---------------+---------+-----------+----------+--------------+  CFV       Full            Yes       Yes                                    +---------+---------------+---------+-----------+----------+--------------+  SFJ       Full                                                             +---------+---------------+---------+-----------+----------+--------------+  FV Prox   Full            Yes       Yes                                    +---------+---------------+---------+-----------+----------+--------------+  FV Mid    Full            Yes        Yes                                    +---------+---------------+---------+-----------+----------+--------------+  FV Distal Full            Yes       Yes                                    +---------+---------------+---------+-----------+----------+--------------+  PFV       Full                                                             +---------+---------------+---------+-----------+----------+--------------+  POP       Full            Yes       Yes                                    +---------+---------------+---------+-----------+----------+--------------+  PTV       Full                                                             +---------+---------------+---------+-----------+----------+--------------+  PERO      Full                                                             +---------+---------------+---------+-----------+----------+--------------+   +----+---------------+---------+-----------+----------+--------------+  LEFT Compressibility Phasicity Spontaneity Properties Thrombus Aging  +----+---------------+---------+-----------+----------+--------------+  CFV  Full            Yes       Yes                                    +----+---------------+---------+-----------+----------+--------------+     Summary: RIGHT: - There is no evidence of deep vein thrombosis in the lower extremity. - There is no evidence of superficial venous thrombosis.  - No cystic structure found in the popliteal fossa.  LEFT: - No evidence of common femoral vein obstruction.  *See table(s) above for measurements and observations. Electronically signed by Sherald Hesshristopher Clark MD on 01/01/2022 at 1:47:42 PM.    Final        Allergies  Allergen Reactions   Grass Extracts [Gramineae Pollens] Hives and Itching   Penicillins     Unknown reaction. Has patient had a PCN reaction causing immediate rash, facial/tongue/throat swelling, SOB or lightheadedness with hypotension: Unknown Has patient had a PCN reaction causing severe  rash involving mucus membranes or skin necrosis: Unknown Has patient had a PCN reaction that required hospitalization: Unknown Has patient had a PCN reaction occurring within the last 10 years: Unknown If all of the above answers are "NO", then may proceed with Cephalosporin use.     Past Medical History:  Diagnosis Date   Asthma    Metabolic syndrome 11/11/2012   From old records   Social History   Socioeconomic History   Marital status: Single    Spouse name: Not on file   Number of children: Not on file   Years of education: Not on file   Highest education level: Not on file  Occupational History   Not on file  Tobacco Use   Smoking status: Some Days    Types: Cigars   Smokeless tobacco: Never  Vaping Use   Vaping Use: Every day  Substance and Sexual Activity   Alcohol use: No   Drug use: No   Sexual activity: Not on file  Other Topics Concern   Not on file  Social History Narrative   ** Merged History Encounter **       Social Determinants  of Health   Financial Resource Strain: Not on file  Food Insecurity: Not on file  Transportation Needs: Not on file  Physical Activity: Not on file  Stress: Not on file  Social Connections: Not on file   Family History  Problem Relation Age of Onset   Healthy Mother    History reviewed. No pertinent surgical history.     Mardella LaymanHagler, Melady Chow, MD 01/18/22 [phone]

## 2022-01-18 NOTE — Discharge Instructions (Addendum)
Keep your follow up with the orthopaedic surgeon. They should be able to manage your pain going forward.  Be aware, you have been prescribed pain medications that may cause drowsiness. While taking this medication, do not take any other medications containing acetaminophen (Tylenol). Do not combine with alcohol or other illicit drugs. Please do not drive, operate heavy machinery, or take part in activities that require making important decisions while on this medication as your judgement may be clouded.

## 2022-01-30 ENCOUNTER — Ambulatory Visit: Payer: Self-pay | Admitting: Student

## 2022-01-30 ENCOUNTER — Encounter (HOSPITAL_COMMUNITY): Payer: Self-pay | Admitting: Student

## 2022-01-30 ENCOUNTER — Other Ambulatory Visit: Payer: Self-pay

## 2022-01-30 DIAGNOSIS — S82121A Displaced fracture of lateral condyle of right tibia, initial encounter for closed fracture: Secondary | ICD-10-CM
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
## 2022-01-30 NOTE — Progress Notes (Signed)
Ms Tokunaga denies chest p ain or shortness of breath. Patient denies having any s/s of Covid in her household.  Patient denies any known exposure to Covid.   Ms. Thornbury does not have a PCP and is not taking any medications.  I instructed Ms Mihalovich to shower with antibiotic soap, if it is available.  Dry off with a clean towel. Do not put lotion, powder, cologne or deodorant or makeup.No jewelry or piercings. Men may shave their face and neck. Woman should not shave. No nail polish, artificial or acrylic nails. Wear clean clothes, brush your teeth. Glasses, contact lens,dentures or partials may not be worn in the OR. If you need to wear them, please bring a case for glasses, do not wear contacts or bring a case, the hospital does not have contact cases, dentures or partials will have to be removed , make sure they are clean, we will provide a denture cup to put them in. You will need some one to drive you home and a responsible person over the age of 23 to stay with you for the first 24 hours after surgery.

## 2022-01-31 ENCOUNTER — Ambulatory Visit (HOSPITAL_COMMUNITY): Payer: Self-pay

## 2022-01-31 ENCOUNTER — Other Ambulatory Visit: Payer: Self-pay

## 2022-01-31 ENCOUNTER — Ambulatory Visit (HOSPITAL_COMMUNITY)
Admission: RE | Admit: 2022-01-31 | Discharge: 2022-01-31 | Disposition: A | Discharge status: Home / Self Care | Payer: Self-pay | Attending: Student | Admitting: Student

## 2022-01-31 ENCOUNTER — Ambulatory Visit (HOSPITAL_BASED_OUTPATIENT_CLINIC_OR_DEPARTMENT_OTHER): Payer: Self-pay | Admitting: Certified Registered Nurse Anesthetist

## 2022-01-31 ENCOUNTER — Encounter (HOSPITAL_COMMUNITY)
Admission: RE | Disposition: A | Discharge status: Home / Self Care | Payer: Self-pay | Source: Home / Self Care | Attending: Student

## 2022-01-31 ENCOUNTER — Encounter (HOSPITAL_COMMUNITY): Payer: Self-pay | Admitting: Student

## 2022-01-31 ENCOUNTER — Ambulatory Visit (HOSPITAL_COMMUNITY): Payer: Self-pay | Admitting: Certified Registered Nurse Anesthetist

## 2022-01-31 DIAGNOSIS — F17290 Nicotine dependence, other tobacco product, uncomplicated: Secondary | ICD-10-CM | POA: Insufficient documentation

## 2022-01-31 DIAGNOSIS — S82141A Displaced bicondylar fracture of right tibia, initial encounter for closed fracture: Secondary | ICD-10-CM | POA: Insufficient documentation

## 2022-01-31 DIAGNOSIS — Z419 Encounter for procedure for purposes other than remedying health state, unspecified: Secondary | ICD-10-CM

## 2022-01-31 DIAGNOSIS — W19XXXA Unspecified fall, initial encounter: Secondary | ICD-10-CM | POA: Insufficient documentation

## 2022-01-31 DIAGNOSIS — J4520 Mild intermittent asthma, uncomplicated: Secondary | ICD-10-CM | POA: Insufficient documentation

## 2022-01-31 DIAGNOSIS — S82121A Displaced fracture of lateral condyle of right tibia, initial encounter for closed fracture: Secondary | ICD-10-CM

## 2022-01-31 DIAGNOSIS — T148XXA Other injury of unspecified body region, initial encounter: Secondary | ICD-10-CM

## 2022-01-31 HISTORY — PX: ORIF TIBIA PLATEAU: SHX2132

## 2022-01-31 LAB — POCT PREGNANCY, URINE: Preg Test, Ur: NEGATIVE

## 2022-01-31 LAB — CBC
HCT: 36.5 % (ref 36.0–46.0)
Hemoglobin: 11.1 g/dL — ABNORMAL LOW (ref 12.0–15.0)
MCH: 24.1 pg — ABNORMAL LOW (ref 26.0–34.0)
MCHC: 30.4 g/dL (ref 30.0–36.0)
MCV: 79.3 fL — ABNORMAL LOW (ref 80.0–100.0)
Platelets: 277 10*3/uL (ref 150–400)
RBC: 4.6 MIL/uL (ref 3.87–5.11)
RDW: 17 % — ABNORMAL HIGH (ref 11.5–15.5)
WBC: 7.5 10*3/uL (ref 4.0–10.5)
nRBC: 0 % (ref 0.0–0.2)

## 2022-01-31 SURGERY — OPEN REDUCTION INTERNAL FIXATION (ORIF) TIBIAL PLATEAU
Anesthesia: General | Site: Leg Upper | Laterality: Right

## 2022-01-31 MED ORDER — OXYCODONE HCL 5 MG PO TABS
ORAL_TABLET | ORAL | Status: AC
  Filled 2022-01-31: qty 1

## 2022-01-31 MED ORDER — PROPOFOL 10 MG/ML IV BOLUS
INTRAVENOUS | Status: DC | PRN
  Administered 2022-01-31: 200 mg via INTRAVENOUS
  Administered 2022-01-31: 30 mg via INTRAVENOUS
  Administered 2022-01-31: 40 mg via INTRAVENOUS

## 2022-01-31 MED ORDER — DEXAMETHASONE SODIUM PHOSPHATE 10 MG/ML IJ SOLN
INTRAMUSCULAR | Status: DC | PRN
Start: 2022-01-31 — End: 2022-01-31
  Administered 2022-01-31: 10 mg via INTRAVENOUS

## 2022-01-31 MED ORDER — ROPIVACAINE HCL 5 MG/ML IJ SOLN
INTRAMUSCULAR | Status: DC | PRN
  Administered 2022-01-31: 15 mL via PERINEURAL
  Administered 2022-01-31: 25 mL via PERINEURAL

## 2022-01-31 MED ORDER — FENTANYL CITRATE (PF) 250 MCG/5ML IJ SOLN
INTRAMUSCULAR | Status: AC
  Filled 2022-01-31: qty 5

## 2022-01-31 MED ORDER — CHLORHEXIDINE GLUCONATE 0.12 % MT SOLN
OROMUCOSAL | Status: AC
  Administered 2022-01-31: 15 mL via OROMUCOSAL
  Filled 2022-01-31: qty 15

## 2022-01-31 MED ORDER — PROPOFOL 10 MG/ML IV BOLUS
INTRAVENOUS | Status: AC
  Filled 2022-01-31: qty 20

## 2022-01-31 MED ORDER — DEXMEDETOMIDINE (PRECEDEX) IN NS 20 MCG/5ML (4 MCG/ML) IV SYRINGE
PREFILLED_SYRINGE | INTRAVENOUS | Status: DC | PRN
  Administered 2022-01-31: 8 ug via INTRAVENOUS
  Administered 2022-01-31: 12 ug via INTRAVENOUS
  Administered 2022-01-31: 8 ug via INTRAVENOUS

## 2022-01-31 MED ORDER — ONDANSETRON HCL 4 MG/2ML IJ SOLN
INTRAMUSCULAR | Status: AC
  Filled 2022-01-31: qty 2

## 2022-01-31 MED ORDER — ONDANSETRON HCL 4 MG/2ML IJ SOLN
INTRAMUSCULAR | Status: DC | PRN
  Administered 2022-01-31: 4 mg via INTRAVENOUS

## 2022-01-31 MED ORDER — PHENYLEPHRINE 40 MCG/ML (10ML) SYRINGE FOR IV PUSH (FOR BLOOD PRESSURE SUPPORT)
PREFILLED_SYRINGE | INTRAVENOUS | Status: AC
  Filled 2022-01-31: qty 10

## 2022-01-31 MED ORDER — 0.9 % SODIUM CHLORIDE (POUR BTL) OPTIME
TOPICAL | Status: DC | PRN
  Administered 2022-01-31: 1000 mL

## 2022-01-31 MED ORDER — OXYCODONE-ACETAMINOPHEN 5-325 MG PO TABS
1.0000 | ORAL_TABLET | ORAL | 0 refills | Status: DC | PRN
Start: 2022-01-31 — End: 2022-06-26

## 2022-01-31 MED ORDER — OXYCODONE HCL 5 MG PO TABS
5.0000 mg | ORAL_TABLET | Freq: Once | ORAL | Status: AC | PRN
  Administered 2022-01-31: 5 mg via ORAL

## 2022-01-31 MED ORDER — ASPIRIN EC 81 MG PO TBEC: 81.0000 mg | DELAYED_RELEASE_TABLET | Freq: Two times a day (BID) | ORAL | 0 refills | Status: AC

## 2022-01-31 MED ORDER — HYDROMORPHONE HCL 1 MG/ML IJ SOLN
INTRAMUSCULAR | Status: AC
  Filled 2022-01-31: qty 1

## 2022-01-31 MED ORDER — MIDAZOLAM HCL 2 MG/2ML IJ SOLN
INTRAMUSCULAR | Status: DC | PRN
  Administered 2022-01-31: 2 mg via INTRAVENOUS

## 2022-01-31 MED ORDER — LIDOCAINE 2% (20 MG/ML) 5 ML SYRINGE
INTRAMUSCULAR | Status: DC | PRN
  Administered 2022-01-31: 60 mg via INTRAVENOUS

## 2022-01-31 MED ORDER — CHLORHEXIDINE GLUCONATE 0.12 % MT SOLN: 15.0000 mL | Freq: Once | OROMUCOSAL | Status: AC

## 2022-01-31 MED ORDER — VANCOMYCIN HCL 1000 MG IV SOLR
INTRAVENOUS | Status: AC
  Filled 2022-01-31: qty 20

## 2022-01-31 MED ORDER — PHENYLEPHRINE 40 MCG/ML (10ML) SYRINGE FOR IV PUSH (FOR BLOOD PRESSURE SUPPORT)
PREFILLED_SYRINGE | INTRAVENOUS | Status: DC | PRN
  Administered 2022-01-31 (×2): 120 ug via INTRAVENOUS

## 2022-01-31 MED ORDER — ROCURONIUM BROMIDE 10 MG/ML (PF) SYRINGE
PREFILLED_SYRINGE | INTRAVENOUS | Status: DC | PRN
  Administered 2022-01-31: 10 mg via INTRAVENOUS
  Administered 2022-01-31: 70 mg via INTRAVENOUS

## 2022-01-31 MED ORDER — METHOCARBAMOL 500 MG PO TABS: 500.0000 mg | ORAL_TABLET | Freq: Four times a day (QID) | ORAL | 0 refills | Status: DC | PRN

## 2022-01-31 MED ORDER — SUGAMMADEX SODIUM 200 MG/2ML IV SOLN
INTRAVENOUS | Status: DC | PRN
  Administered 2022-01-31: 200 mg via INTRAVENOUS

## 2022-01-31 MED ORDER — ONDANSETRON HCL 4 MG/2ML IJ SOLN: 4.0000 mg | Freq: Four times a day (QID) | INTRAMUSCULAR | Status: DC | PRN

## 2022-01-31 MED ORDER — LACTATED RINGERS IV SOLN: INTRAVENOUS | Status: DC

## 2022-01-31 MED ORDER — FENTANYL CITRATE (PF) 250 MCG/5ML IJ SOLN
INTRAMUSCULAR | Status: DC | PRN
  Administered 2022-01-31: 25 ug via INTRAVENOUS
  Administered 2022-01-31: 50 ug via INTRAVENOUS
  Administered 2022-01-31: 100 ug via INTRAVENOUS

## 2022-01-31 MED ORDER — ORAL CARE MOUTH RINSE: 15.0000 mL | Freq: Once | OROMUCOSAL | Status: AC

## 2022-01-31 MED ORDER — VANCOMYCIN HCL 1000 MG IV SOLR
INTRAVENOUS | Status: DC | PRN
  Administered 2022-01-31: 1000 mg

## 2022-01-31 MED ORDER — DEXAMETHASONE SODIUM PHOSPHATE 10 MG/ML IJ SOLN
INTRAMUSCULAR | Status: AC
  Filled 2022-01-31: qty 1

## 2022-01-31 MED ORDER — CEFAZOLIN SODIUM-DEXTROSE 2-4 GM/100ML-% IV SOLN
2.0000 g | INTRAVENOUS | Status: AC
  Administered 2022-01-31: 2 g via INTRAVENOUS
  Filled 2022-01-31: qty 100

## 2022-01-31 MED ORDER — HYDROMORPHONE HCL 1 MG/ML IJ SOLN
0.2500 mg | INTRAMUSCULAR | Status: DC | PRN
  Administered 2022-01-31: 0.25 mg via INTRAVENOUS

## 2022-01-31 MED ORDER — ROCURONIUM BROMIDE 10 MG/ML (PF) SYRINGE
PREFILLED_SYRINGE | INTRAVENOUS | Status: AC
  Filled 2022-01-31: qty 10

## 2022-01-31 MED ORDER — MIDAZOLAM HCL 2 MG/2ML IJ SOLN
INTRAMUSCULAR | Status: AC
  Filled 2022-01-31: qty 2

## 2022-01-31 MED ORDER — LIDOCAINE 2% (20 MG/ML) 5 ML SYRINGE
INTRAMUSCULAR | Status: AC
  Filled 2022-01-31: qty 5

## 2022-01-31 MED ORDER — OXYCODONE HCL 5 MG/5ML PO SOLN: 5.0000 mg | Freq: Once | ORAL | Status: AC | PRN

## 2022-01-31 SURGICAL SUPPLY — 69 items
BAG COUNTER SPONGE SURGICOUNT (BAG) ×2 IMPLANT
BENZOIN TINCTURE PRP APPL 2/3 (GAUZE/BANDAGES/DRESSINGS) ×1 IMPLANT
BIT DRILL CALIB QC 170X80 (BIT) ×1 IMPLANT
BIT DRILL QC SFS 2.5X170 (BIT) ×1 IMPLANT
BLADE CLIPPER SURG (BLADE) IMPLANT
BLADE SURG 15 STRL LF DISP TIS (BLADE) ×1 IMPLANT
BLADE SURG 15 STRL SS (BLADE) ×1
BNDG ELASTIC 4X5.8 VLCR STR LF (GAUZE/BANDAGES/DRESSINGS) ×2 IMPLANT
BNDG ELASTIC 6X5.8 VLCR STR LF (GAUZE/BANDAGES/DRESSINGS) ×2 IMPLANT
BNDG GAUZE ELAST 4 BULKY (GAUZE/BANDAGES/DRESSINGS) ×2 IMPLANT
BRUSH SCRUB EZ PLAIN DRY (MISCELLANEOUS) ×4 IMPLANT
CANISTER SUCT 3000ML PPV (MISCELLANEOUS) ×2 IMPLANT
CHLORAPREP W/TINT 26 (MISCELLANEOUS) ×4 IMPLANT
CLSR STERI-STRIP ANTIMIC 1/2X4 (GAUZE/BANDAGES/DRESSINGS) ×2 IMPLANT
COVER SURGICAL LIGHT HANDLE (MISCELLANEOUS) ×2 IMPLANT
CUFF TOURN SGL QUICK 34 (TOURNIQUET CUFF) ×1
CUFF TRNQT CYL 34X4.125X (TOURNIQUET CUFF) ×1 IMPLANT
DRAPE C-ARM 42X72 X-RAY (DRAPES) ×2 IMPLANT
DRAPE C-ARMOR (DRAPES) ×2 IMPLANT
DRAPE ORTHO SPLIT 77X108 STRL (DRAPES) ×2
DRAPE SURG ORHT 6 SPLT 77X108 (DRAPES) ×2 IMPLANT
DRAPE U-SHAPE 47X51 STRL (DRAPES) ×2 IMPLANT
DRSG PAD ABDOMINAL 8X10 ST (GAUZE/BANDAGES/DRESSINGS) ×3 IMPLANT
ELECT REM PT RETURN 9FT ADLT (ELECTROSURGICAL) ×2
ELECTRODE REM PT RTRN 9FT ADLT (ELECTROSURGICAL) ×1 IMPLANT
GAUZE SPONGE 4X4 12PLY STRL (GAUZE/BANDAGES/DRESSINGS) ×2 IMPLANT
GLOVE SURG ENC MOIS LTX SZ6.5 (GLOVE) ×6 IMPLANT
GLOVE SURG ENC MOIS LTX SZ7.5 (GLOVE) ×8 IMPLANT
GLOVE SURG UNDER POLY LF SZ6.5 (GLOVE) ×2 IMPLANT
GLOVE SURG UNDER POLY LF SZ7.5 (GLOVE) ×2 IMPLANT
GOWN STRL REUS W/ TWL LRG LVL3 (GOWN DISPOSABLE) ×2 IMPLANT
GOWN STRL REUS W/TWL LRG LVL3 (GOWN DISPOSABLE) ×2
IMMOBILIZER KNEE 22 UNIV (SOFTGOODS) ×2 IMPLANT
KIT BASIN OR (CUSTOM PROCEDURE TRAY) ×2 IMPLANT
KIT TURNOVER KIT B (KITS) ×2 IMPLANT
NDL SUT 6 .5 CRC .975X.05 MAYO (NEEDLE) ×1 IMPLANT
NEEDLE MAYO TAPER (NEEDLE) ×1
NS IRRIG 1000ML POUR BTL (IV SOLUTION) ×2 IMPLANT
PACK TOTAL JOINT (CUSTOM PROCEDURE TRAY) ×2 IMPLANT
PAD ARMBOARD 7.5X6 YLW CONV (MISCELLANEOUS) ×4 IMPLANT
PAD CAST 4YDX4 CTTN HI CHSV (CAST SUPPLIES) IMPLANT
PADDING CAST COTTON 4X4 STRL (CAST SUPPLIES) ×1
PADDING CAST COTTON 6X4 STRL (CAST SUPPLIES) ×1 IMPLANT
PLATE TIBIA VA-LCP 6H RT (Plate) ×1 IMPLANT
SCREW CORTEX 3.5 34MM (Screw) ×1 IMPLANT
SCREW CORTEX 3.5 38MM (Screw) ×1 IMPLANT
SCREW CORTEX 3.5 40MM (Screw) ×1 IMPLANT
SCREW CORTEX 3.5 70MM SELF TAP (Screw) ×1 IMPLANT
SCREW CORTEX 3.5X75MM (Screw) ×1 IMPLANT
SCREW LOCK CORT ST 3.5X34 (Screw) IMPLANT
SCREW LOCK CORT ST 3.5X38 (Screw) IMPLANT
SCREW LOCK CORT ST 3.5X40 (Screw) IMPLANT
SCREW LOCKING 3.5X70MM VA (Screw) ×2 IMPLANT
SCREW LOCKING VA 3.5X75MM (Screw) ×1 IMPLANT
STAPLER VISISTAT 35W (STAPLE) ×2 IMPLANT
SUCTION FRAZIER HANDLE 10FR (MISCELLANEOUS) ×1
SUCTION TUBE FRAZIER 10FR DISP (MISCELLANEOUS) ×1 IMPLANT
SUT ETHILON 2 0 FS 18 (SUTURE) ×2 IMPLANT
SUT ETHILON 3 0 PS 1 (SUTURE) IMPLANT
SUT VIC AB 0 CT1 27 (SUTURE)
SUT VIC AB 0 CT1 27XBRD ANBCTR (SUTURE) IMPLANT
SUT VIC AB 1 CT1 18XCR BRD 8 (SUTURE) IMPLANT
SUT VIC AB 1 CT1 27 (SUTURE) ×1
SUT VIC AB 1 CT1 27XBRD ANBCTR (SUTURE) ×1 IMPLANT
SUT VIC AB 1 CT1 8-18 (SUTURE) ×1
SUT VIC AB 2-0 CT1 27 (SUTURE) ×2
SUT VIC AB 2-0 CT1 TAPERPNT 27 (SUTURE) ×2 IMPLANT
TOWEL GREEN STERILE (TOWEL DISPOSABLE) ×4 IMPLANT
WATER STERILE IRR 1000ML POUR (IV SOLUTION) ×4 IMPLANT

## 2022-01-31 NOTE — Anesthesia Procedure Notes (Signed)
Anesthesia Regional Block: Adductor canal block  ? ?Pre-Anesthetic Checklist: , timeout performed,  Correct Patient, Correct Site, Correct Laterality,  Correct Procedure, Correct Position, site marked,  Risks and benefits discussed,  Surgical consent,  Pre-op evaluation,  At surgeon's request and post-op pain management ? ?Laterality: Right ? ?Prep: chloraprep     ?  ?Needles:  ?Injection technique: Single-shot ? ?Needle Type: Echogenic Needle   ? ? ?Needle Length: 9cm  ?Needle Gauge: 21  ? ? ? ?Additional Needles: ? ? ?Narrative:  ?Start time: 01/31/2022 8:18 AM ?End time: 01/31/2022 8:23 AM ?Injection made incrementally with aspirations every 5 mL. ? ?Performed by: Personally  ?Anesthesiologist: Achille Rich, MD ? ?Additional Notes: ?Pt tolerated the procedure well. ? ? ? ? ?

## 2022-01-31 NOTE — Progress Notes (Signed)
Wasted 0.75mg  of Dilaudid with Valetta Close RN ?

## 2022-01-31 NOTE — Interval H&P Note (Signed)
History and Physical Interval Note: ? ?01/31/2022 ?8:27 AM ? ?Margaret Terry  has presented today for surgery, with the diagnosis of Right tibial plateau fracture.  The various methods of treatment have been discussed with the patient and family. After consideration of risks, benefits and other options for treatment, the patient has consented to  Procedure(s): ?OPEN REDUCTION INTERNAL FIXATION (ORIF) TIBIAL PLATEAU (Right) as a surgical intervention.  The patient's history has been reviewed, patient examined, no change in status, stable for surgery.  I have reviewed the patient's chart and labs.  Questions were answered to the patient's satisfaction.   ? ? ?Margaret Terry ? ? ?

## 2022-01-31 NOTE — Anesthesia Procedure Notes (Signed)
Procedure Name: Intubation ?Date/Time: 01/31/2022 8:44 AM ?Performed by: Erick Colace, CRNA ?Pre-anesthesia Checklist: Patient identified, Emergency Drugs available, Suction available and Patient being monitored ?Patient Re-evaluated:Patient Re-evaluated prior to induction ?Oxygen Delivery Method: Circle system utilized ?Preoxygenation: Pre-oxygenation with 100% oxygen ?Induction Type: IV induction ?Ventilation: Mask ventilation without difficulty ?Laryngoscope Size: Mac and 4 ?Grade View: Grade I ?Tube type: Oral ?Tube size: 7.0 mm ?Number of attempts: 1 ?Airway Equipment and Method: Stylet and Oral airway ?Placement Confirmation: ETT inserted through vocal cords under direct vision, positive ETCO2 and breath sounds checked- equal and bilateral ?Secured at: 22 cm ?Tube secured with: Tape ?Dental Injury: Teeth and Oropharynx as per pre-operative assessment  ? ? ? ? ?

## 2022-01-31 NOTE — Op Note (Signed)
Orthopaedic Surgery Operative Note (CSN: [id number] ) ?Date of Surgery: 01/31/2022  ?Admit Date: 01/31/2022  ? ?Diagnoses: ?Pre-Op Diagnoses: ?Right lateral tibial plateau fracture ? ?Post-Op Diagnosis: ?Same ? ?Procedures: ?CPT 27535-Open reduction internal fixation of right tibial plateau fracture ? ?Surgeons : ?Primary: Roby Lofts, MD ? ?Assistant: Ulyses Southward, PA-C ? ?Location: OR 6  ? ?Anesthesia:General with regional anesthesia  ? ?Antibiotics: Ancef 2g preopwith 1 gm vancomycin powder  ? ?Tourniquet time:* Missing tourniquet times found for documented tourniquets in log: [id number] * ?Total Tourniquet Time Documented: ?Thigh (Right) - 60 minutes ?Total: Thigh (Right) - 60 minutes ? ?Estimated Blood Loss:Minimal ? ?Complications:None ? ?Specimens:None  ? ?Implants: ?Implant Name Type Inv. Item Serial No. Manufacturer Lot No. LRB No. Used Action  ?PLATE TIBIA VA-LCP 6H RT - [id number] Plate PLATE TIBIA VA-LCP 6H RT  [employer]  Right 1 Implanted  ?SCREW CORTEX 3.5 - [id number] Screw SCREW CORTEX 3.5  [employer]  Right 1 Implanted  ?SCREW CORTEX 3.5 SELF TAP - [id number] Screw SCREW CORTEX 3.5 SELF TAP  [employer]  Right 1 Implanted  ?SCREW CORTEX 3.5X75MM - [id number] Screw SCREW CORTEX 3.5X75MM  [employer]  Right 1 Implanted  ?SCREW CORTEX 3.5 - [id number] Screw SCREW CORTEX 3.5  [employer]  Right 1 Implanted  ?SCREW CORTEX 3.5 - [id number] Screw SCREW CORTEX 3.5  [employer]  Right 1 Implanted  ?SCREW LOCKING 3.5X70MM VA - [id number] Screw SCREW LOCKING 3.5X70MM VA  [employer]  Right 2 Implanted  ?SCREW LOCKING VA 3.5X75MM - [id number] Screw SCREW LOCKING VA 3.5X75MM  [employer]  Right 1 Implanted  ?  ? ?Indications for Surgery: ?26 year old female who sustained a right lateral tibial plateau fracture with significant joint involvement.  Due to the unstable nature of her injury and valgus alignment of her proximal  tibia I recommended proceeding with open reduction internal fixation.  Risks and benefits were discussed with the patient.  Risks included but not limited to bleeding, infection, malunion, nonunion, hardware failure, hardware irritation, nerve and blood vessel injury, posttraumatic arthritis, knee stiffness, even the possibility anesthetic complications.  She agreed to proceed with surgery and consent was obtained. ? ?Operative Findings: ?1.  Open reduction internal fixation of subacute right lateral tibial plateau fracture using Synthes 3.5 mm VA proximal tibial locking plate. ?2.  Meniscus was intact without any signs of any tearing. ? ?Procedure: ?The patient was identified in the preoperative holding area. Consent was confirmed with the patient and their family and all questions were answered. The operative extremity was marked after confirmation with the patient. she was then brought back to the operating room by our anesthesia colleagues.  She was carefully transferred over to radiolucent flat top table.  A nonsterile tourniquet was placed on her upper thigh.  She was placed under general anesthetic.  A bump was placed under her operative hip.  The right lower extremity was then prepped and draped in usual sterile fashion.  A timeout was performed to verify the patient, the procedure, and the extremity.  Preoperative antibiotics were dosed. ? ?Fluoroscopic imaging was obtained to show the unstable nature of her injury.  The tourniquet was inflated to 250 mmHg.  Total tourniquet time as noted above.  A lateral parapatellar incision was then made and carried down through skin subcutaneous tissue.  I incised through the IT band and developed the interval between the capsule and the IT band.  I then released the  IT band off of the lateral condyle of the proximal tibia.  Here I encountered the split fracture that already had some callus that had formed.  I then performed a submeniscal arthrotomy with a 15 blade.  I  tagged the capsule with Vicryl sutures for later repair.  The meniscus appeared to be intact however there was significant impaction and displacement of the articular surface in the anterior lateral aspect of the condyle. ? ?Used an osteotome to refracture the split of the lateral condyle to access the the impacted articular surface.  I then used a Cobb elevator to disimpact the articular surface and elevated back up to an anatomic level.  I then held this provisionally with a 1.6 mm K wire.  I confirmed adequate reduction of the joint line both visually and with radiographic imaging.  I then reduced the condyle back to the metaphysis and I used a reduction tenaculum to hold in place.  I then drilled and placed a 3.5 mm nonlocking screw to hold this provisionally to remove the clamp.  I then placed a 6-hole Synthes VA proximal tibial locking plate and slid this submuscularly along the lateral cortex of the tibia.  I held provisionally with a K wire and confirmed positioning with fluoroscopy.  I then drilled a nonlocking screw proximally to bring the plate flush to bone.  I then drilled percutaneous to the tibial shaft and brought the plate flush to bone with 3.5 mm nonlocking screws.  A total of 3 nonlocking screws were placed into the tibial shaft.  I then returned to the proximal segment and I used 3.5 mm locking screws to raft the disimpacted articular surface. ? ?Final fluoroscopic imaging was obtained.  The incision was copiously irrigated.  A free needle was used to repair the tag capsule back down to the plate.  A gram of vancomycin powder was placed into the incision.  Layered closure of 0 Vicryl, 2-0 Vicryl and 3-0 Monocryl with Steri-Strips were used to close the skin.  Sterile dressing was applied.  The patient was then awoken from anesthesia and taken to the PACU in stable condition. ? ?Post Op Plan/Instructions: ?Patient be nonweightbearing to the right lower extremity.  She will have unrestricted range  of motion of the knee.  She will be discharged from the PACU postoperatively.  We will have her follow-up in 2 weeks for repeat x-rays.  She will be placed on aspirin 81 mg for DVT prophylaxis. ? ?I was present and performed the entire surgery. ? ?Ulyses Southward, PA-C did assist me throughout the case. An assistant was necessary given the difficulty in approach, maintenance of reduction and ability to instrument the fracture. ? ? ?Truitt Merle, MD ?[facility]  ?

## 2022-01-31 NOTE — Discharge Instructions (Addendum)
? ?[facility] Discharge Instructions ? ? ?General Discharge Instructions ? ?WEIGHT BEARING STATUS: non-weightbearing right lower extremity ? ?RANGE OF MOTION/ACTIVITY:Ok for knee range of motion as tolerated ? ?Wound Care:You may remove your surgical dressing on post-op day #3 (Saturday 02/03/22). Leave steri-strips in place. Incisions can be left open to air if there is no drainage. If incision continues to have drainage, follow wound care instructions below. Okay to shower if no drainage from incisions. ? ?DVT/PE prophylaxis: Aspirin 81 mg twice daily x 30 days ? ?Diet: as you were eating previously.  Can use over the counter stool softeners and bowel preparations, such as Miralax, to help with bowel movements.  Narcotics can be constipating.  Be sure to drink plenty of fluids ? ?PAIN MEDICATION USE AND EXPECTATIONS ? You have likely been given narcotic medications to help control your pain.  After a traumatic event that results in an fracture (broken bone) with or without surgery, it is ok to use narcotic pain medications to help control one's pain.  We understand that everyone responds to pain differently and each individual patient will be evaluated on a regular basis for the continued need for narcotic medications. Ideally, narcotic medication use should last no more than 6-8 weeks (coinciding with fracture healing).  ? As a patient it is your responsibility as well to monitor narcotic medication use and report the amount and frequency you use these medications when you come to your office visit.  ? We would also advise that if you are using narcotic medications, you should take a dose prior to therapy to maximize you participation. ? ?IF YOU ARE ON NARCOTIC MEDICATIONS IT IS NOT PERMISSIBLE TO OPERATE A MOTOR VEHICLE (MOTORCYCLE/CAR/TRUCK/MOPED) OR HEAVY MACHINERY ?DO NOT MIX NARCOTICS WITH OTHER CNS (CENTRAL NERVOUS SYSTEM) DEPRESSANTS SUCH AS ALCOHOL ? ? ?STOP SMOKING OR USING NICOTINE  PRODUCTS!!!! ? As discussed nicotine severely impairs your body's ability to heal surgical and traumatic wounds but also impairs bone healing.  Wounds and bone heal by forming microscopic blood vessels (angiogenesis) and nicotine is a vasoconstrictor (essentially, shrinks blood vessels).  Therefore, if vasoconstriction occurs to these microscopic blood vessels they essentially disappear and are unable to deliver necessary nutrients to the healing tissue.  This is one modifiable factor that you can do to dramatically increase your chances of healing your injury.   ? (This means no smoking, no nicotine gum, patches, etc) ? ?DO NOT USE NONSTEROIDAL ANTI-INFLAMMATORY DRUGS (NSAID'S) ? Using products such as Advil (ibuprofen), Aleve (naproxen), Motrin (ibuprofen) for additional pain control during fracture healing can delay and/or prevent the healing response.  If you would like to take over the counter (OTC) medication, Tylenol (acetaminophen) is ok.  However, some narcotic medications that are given for pain control contain acetaminophen as well. Therefore, you should not exceed more than 4000 mg of tylenol in a day if you do not have liver disease.  Also note that there are may OTC medicines, such as cold medicines and allergy medicines that my contain tylenol as well.  If you have any questions about medications and/or interactions please ask your doctor/PA or your pharmacist.  ?   ? ?ICE AND ELEVATE INJURED/OPERATIVE EXTREMITY ? Using ice and elevating the injured extremity above your heart can help with swelling and pain control.  Icing in a pulsatile fashion, such as 20 minutes on and 20 minutes off, can be followed.   ? Do not place ice directly on skin. Make sure there is a  barrier between to skin and the ice pack.   ? Using frozen items such as frozen peas works well as the conform nicely to the are that needs to be iced. ? ?USE AN ACE WRAP OR TED HOSE FOR SWELLING CONTROL ? In addition to icing and elevation,  Ace wraps or TED hose are used to help limit and resolve swelling.  It is recommended to use Ace wraps or TED hose until you are informed to stop.   ? When using Ace Wraps start the wrapping distally (farthest away from the body) and wrap proximally (closer to the body) ?  Example: If you had surgery on your leg or thing and you do not have a splint on, start the ace wrap at the toes and work your way up to the thigh ?       If you had surgery on your upper extremity and do not have a splint on, start the ace wrap at your fingers and work your way up to the upper arm ? ? ?CALL THE OFFICE FOR MEDICATION REFILLS OR WITH ANY QUESTIONS OR CONCERNS: [phone]  ? ?VISIT OUR WEBSITE FOR ADDITIONAL INFORMATION: [email or web address] ?  ? ?Discharge Wound Care Instructions ? ?Do NOT apply any ointments, solutions or lotions to pin sites or surgical wounds.  These prevent needed drainage and even though solutions like hydrogen peroxide kill bacteria, they also damage cells lining the pin sites that help fight infection.  Applying lotions or ointments can keep the wounds moist and can cause them to breakdown and open up as well. This can increase the risk for infection. When in doubt call the office. ? ?Surgical incisions should be dressed daily. ? ?If any drainage is noted, use one layer of adaptic or Mepitel, then gauze, Kerlix, and an ace wrap. ?- These dressing supplies should be available at local medical supply stores [facility], [facility], etc) as well as Insurance claims handler ([facility], [facility], [employer], etc) ? ?Once the incision is completely dry and without drainage, it may be left open to air out.  Showering may begin 36-48 hours later.  Cleaning gently with soap and water. ? ? ? ? ?

## 2022-01-31 NOTE — Transfer of Care (Signed)
Immediate Anesthesia Transfer of Care Note ? ?Patient: Margaret Terry ? ?Procedure(s) Performed: OPEN REDUCTION INTERNAL FIXATION (ORIF) TIBIAL PLATEAU (Right: Leg Upper) ? ?Patient Location: PACU ? ?Anesthesia Type:General ? ?Level of Consciousness: drowsy ? ?Airway & Oxygen Therapy: Patient Spontanous Breathing and Patient connected to face mask oxygen ? ?Post-op Assessment: Report given to RN and Post -op Vital signs reviewed and stable ? ?Post vital signs: Reviewed and stable ? ?Last Vitals:  ?Vitals Value Taken Time  ?BP 107/54 01/31/22 1016  ?Temp    ?Pulse 70 01/31/22 1017  ?Resp 17 01/31/22 1017  ?SpO2 100 % 01/31/22 1017  ?Vitals shown include unvalidated device data. ? ?Last Pain:  ?Vitals:  ? 01/31/22 0659  ?TempSrc:   ?PainSc: 0-No pain  ?   ? ?  ? ?Complications: No notable events documented. ?

## 2022-01-31 NOTE — Anesthesia Procedure Notes (Signed)
Anesthesia Regional Block: Popliteal block  ? ?Pre-Anesthetic Checklist: , timeout performed,  Correct Patient, Correct Site, Correct Laterality,  Correct Procedure, Correct Position, site marked,  Risks and benefits discussed,  Surgical consent,  Pre-op evaluation,  At surgeon's request and post-op pain management ? ?Laterality: Right ? ?Prep: chloraprep     ?  ?Needles:  ?Injection technique: Single-shot ? ?Needle Type: Echogenic Stimulator Needle   ? ? ? ? ? ? ? ?Additional Needles: ? ? ?Procedures:, nerve stimulator,,,,,    ? ?Nerve Stimulator or Paresthesia:  ?Response: plantar flexion of foot, 0.45 mA ? ?Additional Responses:  ? ?Narrative:  ?Start time: 01/31/2022 8:10 AM ?End time: 01/31/2022 8:18 AM ?Injection made incrementally with aspirations every 5 mL. ? ?Performed by: Personally  ?Anesthesiologist: Albertha Ghee, MD ? ?Additional Notes: ?Functioning IV was confirmed and monitors were applied.  A 1mm 21ga Arrow echogenic stimulator needle was used. Sterile prep and drape,hand hygiene and sterile gloves were used.  Negative aspiration and negative test dose prior to incremental administration of local anesthetic. The patient tolerated the procedure well. ? Ultrasound guidance: relevent anatomy identified, needle position confirmed, local anesthetic spread visualized around nerve(s), vascular puncture avoided.  Image printed for medical record.  ? ? ? ? ?

## 2022-01-31 NOTE — Anesthesia Preprocedure Evaluation (Signed)
Anesthesia Evaluation  ?Patient identified by MRN, date of birth, ID band ?Patient awake ? ? ? ?Reviewed: ?Allergy & Precautions, H&P , NPO status , Patient's Chart, lab work & pertinent test results ? ?Airway ?Mallampati: II ? ? ?Neck ROM: full ? ? ? Dental ?  ?Pulmonary ?asthma , Current Smoker and Patient abstained from smoking.,  ?  ?breath sounds clear to auscultation ? ? ? ? ? ? Cardiovascular ?negative cardio ROS ? ? ?Rhythm:regular Rate:Normal ? ? ?  ?Neuro/Psych ?  ? GI/Hepatic ?(+)  ?  ? substance abuse ? marijuana use,   ?Endo/Other  ? ? Renal/GU ?  ? ?  ?Musculoskeletal ? ? Abdominal ?  ?Peds ? Hematology ?  ?Anesthesia Other Findings ? ? Reproductive/Obstetrics ? ?  ? ? ? ? ? ? ? ? ? ? ? ? ? ?  ?  ? ? ? ? ? ? ? ?Anesthesia Physical ?Anesthesia Plan ? ?ASA: 2 ? ?Anesthesia Plan: General  ? ?Post-op Pain Management:   ? ?Induction: Intravenous ? ?PONV Risk Score and Plan: 2 and Ondansetron, Dexamethasone, Midazolam and Treatment may vary due to age or medical condition ? ?Airway Management Planned: Oral ETT ? ?Additional Equipment:  ? ?Intra-op Plan:  ? ?Post-operative Plan: Extubation in OR ? ?Informed Consent: I have reviewed the patients History and Physical, chart, labs and discussed the procedure including the risks, benefits and alternatives for the proposed anesthesia with the patient or authorized representative who has indicated his/her understanding and acceptance.  ? ? ? ?Dental advisory given ? ?Plan Discussed with: CRNA, Anesthesiologist and Surgeon ? ?Anesthesia Plan Comments:   ? ? ? ? ? ? ?Anesthesia Quick Evaluation ? ?

## 2022-02-01 ENCOUNTER — Encounter (HOSPITAL_COMMUNITY): Payer: Self-pay | Admitting: Student

## 2022-02-01 NOTE — Anesthesia Postprocedure Evaluation (Signed)
Anesthesia Post Note ? ?Patient: Linnell Swords ? ?Procedure(s) Performed: OPEN REDUCTION INTERNAL FIXATION (ORIF) TIBIAL PLATEAU (Right: Leg Upper) ? ?  ? ?Patient location during evaluation: PACU ?Anesthesia Type: General and Regional ?Level of consciousness: awake and alert ?Pain management: pain level controlled ?Vital Signs Assessment: post-procedure vital signs reviewed and stable ?Respiratory status: spontaneous breathing, nonlabored ventilation, respiratory function stable and patient connected to nasal cannula oxygen ?Cardiovascular status: blood pressure returned to baseline and stable ?Postop Assessment: no apparent nausea or vomiting ?Anesthetic complications: no ? ? ?No notable events documented. ? ?Last Vitals:  ?Vitals:  ? 01/31/22 1100 01/31/22 1115  ?BP: (!) 105/53 105/64  ?Pulse: (!) 58 (!) 58  ?Resp: 14 14  ?Temp:    ?SpO2: 99% 100%  ?  ?Last Pain:  ?Vitals:  ? 01/31/22 1115  ?TempSrc:   ?PainSc: Asleep  ? ? ?  ?  ?  ?  ?  ?  ? ?Kaden Daughdrill S ? ? ? ? ?

## 2022-06-26 ENCOUNTER — Ambulatory Visit (HOSPITAL_COMMUNITY)
Admission: EM | Admit: 2022-06-26 | Discharge: 2022-06-26 | Disposition: A | Discharge status: Home / Self Care | Payer: Self-pay | Attending: Urgent Care | Admitting: Urgent Care

## 2022-06-26 ENCOUNTER — Ambulatory Visit (INDEPENDENT_AMBULATORY_CARE_PROVIDER_SITE_OTHER): Payer: Self-pay

## 2022-06-26 ENCOUNTER — Encounter (HOSPITAL_COMMUNITY): Payer: Self-pay

## 2022-06-26 DIAGNOSIS — M25561 Pain in right knee: Secondary | ICD-10-CM

## 2022-06-26 DIAGNOSIS — G8929 Other chronic pain: Secondary | ICD-10-CM

## 2022-06-26 MED ORDER — IBUPROFEN 600 MG PO TABS: 600.0000 mg | ORAL_TABLET | Freq: Four times a day (QID) | ORAL | 0 refills | Status: DC | PRN

## 2022-06-26 NOTE — Discharge Instructions (Addendum)
Your x-ray does not show any acute abnormality or fracture. Please start taking 600 mg ibuprofen every 6 hours as needed for the pain.  Take this with food. Please call your surgeon tomorrow to schedule a follow-up evaluation.

## 2022-06-26 NOTE — ED Triage Notes (Signed)
Pt states fractured her RLE 3 months ago. States having swelling to rt foot, pain, and losing her balance.
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
## 2022-08-12 ENCOUNTER — Other Ambulatory Visit: Payer: Self-pay

## 2022-08-12 ENCOUNTER — Emergency Department (HOSPITAL_COMMUNITY): Payer: Medicaid Other

## 2022-08-12 ENCOUNTER — Emergency Department (HOSPITAL_COMMUNITY)
Admission: EM | Admit: 2022-08-12 | Discharge: 2022-08-12 | Disposition: A | Discharge status: Home / Self Care | Payer: Self-pay | Attending: Emergency Medicine | Admitting: Emergency Medicine

## 2022-08-12 ENCOUNTER — Encounter (HOSPITAL_COMMUNITY): Payer: Self-pay

## 2022-08-12 DIAGNOSIS — Z20822 Contact with and (suspected) exposure to covid-19: Secondary | ICD-10-CM | POA: Insufficient documentation

## 2022-08-12 DIAGNOSIS — J029 Acute pharyngitis, unspecified: Secondary | ICD-10-CM

## 2022-08-12 DIAGNOSIS — J45909 Unspecified asthma, uncomplicated: Secondary | ICD-10-CM | POA: Insufficient documentation

## 2022-08-12 DIAGNOSIS — B9789 Other viral agents as the cause of diseases classified elsewhere: Secondary | ICD-10-CM | POA: Insufficient documentation

## 2022-08-12 DIAGNOSIS — J028 Acute pharyngitis due to other specified organisms: Secondary | ICD-10-CM | POA: Insufficient documentation

## 2022-08-12 LAB — RESP PANEL BY RT-PCR (FLU A&B, COVID) ARPGX2
Influenza A by PCR: NEGATIVE
Influenza B by PCR: NEGATIVE
SARS Coronavirus 2 by RT PCR: NEGATIVE

## 2022-08-12 LAB — GROUP A STREP BY PCR: Group A Strep by PCR: NOT DETECTED

## 2022-08-12 MED ORDER — IPRATROPIUM-ALBUTEROL 0.5-2.5 (3) MG/3ML IN SOLN
3.0000 mL | Freq: Once | RESPIRATORY_TRACT | Status: AC
  Administered 2022-08-12: 3 mL via RESPIRATORY_TRACT
  Filled 2022-08-12: qty 3

## 2022-08-12 MED ORDER — ALBUTEROL SULFATE HFA 108 (90 BASE) MCG/ACT IN AERS: 1.0000 | INHALATION_SPRAY | Freq: Four times a day (QID) | RESPIRATORY_TRACT | 0 refills | Status: DC | PRN

## 2022-08-12 MED ORDER — ALBUTEROL SULFATE HFA 108 (90 BASE) MCG/ACT IN AERS
1.0000 | INHALATION_SPRAY | RESPIRATORY_TRACT | Status: DC | PRN
  Administered 2022-08-12: 2 via RESPIRATORY_TRACT

## 2022-08-12 NOTE — Discharge Instructions (Addendum)
You were seen in the emergency department today for sore throat and chest tightness.  You likely have a viral upper respiratory infection that is exacerbating your asthma.  Your chest x-ray does not demonstrate any pneumonia.  You do not have COVID, flu or strep throat.  I have provided you with an albuterol inhaler for you to use as needed for chest tightness or wheezing.  Please return if you have significantly worsening shortness of breath or difficulty breathing.

## 2022-08-12 NOTE — ED Provider Notes (Signed)
[facility] Provider Note   CSN: [id number] Arrival date & time: 08/12/22  6063     History  Chief Complaint  Patient presents with   Sore Throat    Margaret Terry is a 26 y.o. female.  With past medical history of asthma who presents to the emergency department with sore throat.  Patient states symptoms began 2 days ago.  She describes having sore throat.  States that since then she has had intermittent shortness of breath and chest tightness.  States that her girlfriend had a client who came over to the house who was sick.  Since then they have both had symptoms.  She denies using her albuterol inhaler at home.  She denies having any rhinorrhea, congestion, productive cough, fever, sinus pain or pressure, myalgias.   Sore Throat Associated symptoms include shortness of breath.       Home Medications Prior to Admission medications   Medication Sig Start Date End Date Taking? Authorizing Provider  albuterol (VENTOLIN HFA) 108 (90 Base) MCG/ACT inhaler Inhale 1-2 puffs into the lungs every 6 (six) hours as needed for wheezing or shortness of breath. 08/12/22  Yes Cristopher Peru, PA-C  albuterol (VENTOLIN HFA) 108 (90 Base) MCG/ACT inhaler Inhale 1-2 puffs into the lungs every 6 (six) hours as needed for wheezing or shortness of breath. Patient not taking: Reported on 01/30/2022 05/28/20   Riki Sheer, PA-C  ibuprofen (ADVIL) 600 MG tablet Take 1 tablet (600 mg total) by mouth every 6 (six) hours as needed. Take with food 06/26/22   Crain, Alphonzo Lemmings L, PA      Allergies    Grass extracts [gramineae pollens] and Penicillins    Review of Systems   Review of Systems  HENT:  Positive for sore throat.   Respiratory:  Positive for shortness of breath.   All other systems reviewed and are negative.   Physical Exam Updated Vital Signs BP (!) 116/104 (BP Location: Right Arm)   Pulse 88   Temp 99 F (37.2 C) (Oral)   Resp 16   SpO2 100%   Physical Exam Vitals and nursing note reviewed.  Constitutional:      General: She is not in acute distress.    Appearance: She is well-developed. She is ill-appearing. She is not toxic-appearing.  HENT:     Head: Normocephalic and atraumatic.     Mouth/Throat:     Mouth: Mucous membranes are moist.     Pharynx: Oropharynx is clear. Uvula midline. Posterior oropharyngeal erythema present. No pharyngeal swelling or oropharyngeal exudate.     Tonsils: No tonsillar exudate. 1+ on the right. 1+ on the left.  Eyes:     General: No scleral icterus.    Pupils: Pupils are equal, round, and reactive to light.  Cardiovascular:     Heart sounds: Normal heart sounds. No murmur heard. Pulmonary:     Effort: Pulmonary effort is normal. No tachypnea or respiratory distress.     Breath sounds: No stridor. Examination of the right-upper field reveals wheezing. Examination of the left-upper field reveals wheezing. Wheezing present. No rhonchi or rales.  Skin:    General: Skin is warm and dry.     Capillary Refill: Capillary refill takes less than 2 seconds.     Findings: No rash.  Neurological:     General: No focal deficit present.     Mental Status: She is alert and oriented to person, place, and time.  Psychiatric:  Mood and Affect: Mood normal.        Behavior: Behavior normal.        Thought Content: Thought content normal.        Judgment: Judgment normal.    ED Results / Procedures / Treatments   Labs (all labs ordered are listed, but only abnormal results are displayed) Labs Reviewed  GROUP A STREP BY PCR  RESP PANEL BY RT-PCR (FLU A&B, COVID) [id number]   EKG None  Radiology DG Chest 2 View  Result Date: 08/12/2022 CLINICAL DATA:  Acute chest pain for 4 days. EXAM: CHEST - 2 VIEW COMPARISON:  08/01/2019 and prior radiographs FINDINGS: The cardiomediastinal silhouette is unremarkable. Mild peribronchial thickening is unchanged. There is no evidence of focal airspace disease,  pulmonary edema, suspicious pulmonary nodule/mass, pleural effusion, or pneumothorax. No acute bony abnormalities are identified. IMPRESSION: No active cardiopulmonary disease. Electronically Signed   By: Harmon Pier M.D.   On: 08/12/2022 09:01    Procedures Procedures   Medications Ordered in ED Medications  albuterol (VENTOLIN HFA) 108 (90 Base) MCG/ACT inhaler 1-2 puff (has no administration in time range)  ipratropium-albuterol (DUONEB) 0.5-2.5 (3) MG/3ML nebulizer solution 3 mL (3 mLs Nebulization Given 08/12/22 0902)    ED Course/ Medical Decision Making/ A&P                           Medical Decision Making Amount and/or Complexity of Data Reviewed Radiology: ordered.  Risk Prescription drug management.  This patient presents to the ED with chief complaint(s) of sore throat, shortness of breath with pertinent past medical history of asthma which further complicates the presenting complaint. The complaint involves an extensive differential diagnosis and also carries with it a high risk of complications and morbidity.    The differential diagnosis includes strep, viral upper respiratory infection, asthma exacerbation or pneumonia, etc.  Additional history obtained: Additional history obtained from  none available Records reviewed Care Everywhere/External Records and Primary Care Documents  ED Course and Reassessment: 26 year old female who presents to the emergency department with sore throat, shortness of breath.  On physical exam she does have wheezing in the bilateral upper lobes.  There is no respiratory distress.  She does have some oropharyngeal erythema without tonsillar exudates.  Symptoms are inconsistent with a PTA, RPA, Ludwigs angina, EBV, bacterial tracheitis, epiglottitis.  There is no change in phonation or difficulty swallowing. Strep is negative Given her symptomatic shortness of breath, she did cough in the room will obtain COVID and flu.  We will also obtain  chest x-ray and give her a DuoNeb breathing treatment  Chest x-ray is negative.  COVID and flu is negative. On repeat exam she is feeling improved.  Feel that this is likely a viral upper respiratory infection.  No evidence of pneumonia.  No acute respiratory distress or a severe asthma exacerbation requiring further intervention at this time.  Instructed to use over-the-counter cough and cold medications as well as Tylenol and Motrin for symptomatic relief of symptoms.  We will provide her with an albuterol inhaler here for home.  Given return precautions for worsening shortness of breath or difficulty breathing.  She verbalized understanding.  Otherwise feel that she is safe for discharge at this time.  Independent labs interpretation:  The following labs were independently interpreted: Strep negative, COVID and flu negative  Independent visualization of imaging: - I independently visualized the following imaging with scope of interpretation limited to determining acute  life threatening conditions related to emergency care: Chest x-ray, which revealed no evidence of pneumonia, pleural effusion, pneumothorax  Consultation: - Consulted or discussed management/test interpretation w/ external professional: Not indicated  Consideration for admission or further workup: Not indicated Social Determinants of health: None identified Final Clinical Impression(s) / ED Diagnoses Final diagnoses:  Viral pharyngitis    Rx / DC Orders ED Discharge Orders          Ordered    albuterol (VENTOLIN HFA) 108 (90 Base) MCG/ACT inhaler  Every 6 hours PRN        08/12/22 0954              Cristopher Peru, PA-C 08/12/22 1610    Pricilla Loveless, MD 08/16/22 [phone]

## 2022-08-12 NOTE — ED Triage Notes (Signed)
Patient said she has had a sore throat for 2 days. Burning to swallowing. Said she has asthma and feels like her chest is getting tight but she ran out of her inhaler.

## 2023-02-06 ENCOUNTER — Observation Stay (HOSPITAL_COMMUNITY)
Admission: EM | Admit: 2023-02-06 | Discharge: 2023-02-07 | Disposition: A | Discharge status: Home / Self Care | Payer: Medicaid Other | Attending: Family Medicine | Admitting: Family Medicine

## 2023-02-06 ENCOUNTER — Emergency Department (HOSPITAL_COMMUNITY): Payer: Medicaid Other

## 2023-02-06 ENCOUNTER — Other Ambulatory Visit: Payer: Self-pay

## 2023-02-06 DIAGNOSIS — R0602 Shortness of breath: Secondary | ICD-10-CM | POA: Diagnosis present

## 2023-02-06 DIAGNOSIS — U071 COVID-19: Principal | ICD-10-CM | POA: Insufficient documentation

## 2023-02-06 DIAGNOSIS — E876 Hypokalemia: Secondary | ICD-10-CM | POA: Insufficient documentation

## 2023-02-06 DIAGNOSIS — J45901 Unspecified asthma with (acute) exacerbation: Secondary | ICD-10-CM | POA: Diagnosis not present

## 2023-02-06 DIAGNOSIS — A4189 Other specified sepsis: Secondary | ICD-10-CM | POA: Diagnosis not present

## 2023-02-06 DIAGNOSIS — J45909 Unspecified asthma, uncomplicated: Secondary | ICD-10-CM | POA: Diagnosis present

## 2023-02-06 DIAGNOSIS — F17290 Nicotine dependence, other tobacco product, uncomplicated: Secondary | ICD-10-CM | POA: Diagnosis not present

## 2023-02-06 DIAGNOSIS — E871 Hypo-osmolality and hyponatremia: Secondary | ICD-10-CM | POA: Insufficient documentation

## 2023-02-06 DIAGNOSIS — J101 Influenza due to other identified influenza virus with other respiratory manifestations: Secondary | ICD-10-CM

## 2023-02-06 LAB — CBC WITH DIFFERENTIAL/PLATELET
Abs Immature Granulocytes: 0.03 10*3/uL (ref 0.00–0.07)
Basophils Absolute: 0 10*3/uL (ref 0.0–0.1)
Basophils Relative: 0 %
Eosinophils Absolute: 0.1 10*3/uL (ref 0.0–0.5)
Eosinophils Relative: 2 %
HCT: 40.3 % (ref 36.0–46.0)
Hemoglobin: 12.8 g/dL (ref 12.0–15.0)
Immature Granulocytes: 0 %
Lymphocytes Relative: 30 %
Lymphs Abs: 2 10*3/uL (ref 0.7–4.0)
MCH: 24 pg — ABNORMAL LOW (ref 26.0–34.0)
MCHC: 31.8 g/dL (ref 30.0–36.0)
MCV: 75.5 fL — ABNORMAL LOW (ref 80.0–100.0)
Monocytes Absolute: 0.8 10*3/uL (ref 0.1–1.0)
Monocytes Relative: 12 %
Neutro Abs: 3.8 10*3/uL (ref 1.7–7.7)
Neutrophils Relative %: 56 %
Platelets: 239 10*3/uL (ref 150–400)
RBC: 5.34 MIL/uL — ABNORMAL HIGH (ref 3.87–5.11)
RDW: 17.8 % — ABNORMAL HIGH (ref 11.5–15.5)
WBC: 6.7 10*3/uL (ref 4.0–10.5)
nRBC: 0 % (ref 0.0–0.2)

## 2023-02-06 LAB — COMPREHENSIVE METABOLIC PANEL
ALT: 19 U/L (ref 0–44)
AST: 29 U/L (ref 15–41)
Albumin: 3.8 g/dL (ref 3.5–5.0)
Alkaline Phosphatase: 59 U/L (ref 38–126)
Anion gap: 13 (ref 5–15)
BUN: 6 mg/dL (ref 6–20)
CO2: 18 mmol/L — ABNORMAL LOW (ref 22–32)
Calcium: 9 mg/dL (ref 8.9–10.3)
Chloride: 102 mmol/L (ref 98–111)
Creatinine, Ser: 1.03 mg/dL — ABNORMAL HIGH (ref 0.44–1.00)
GFR, Estimated: >60 mL/min (ref >60)
Glucose, Bld: 131 mg/dL — ABNORMAL HIGH (ref 70–99)
Potassium: 2.9 mmol/L — ABNORMAL LOW (ref 3.5–5.1)
Sodium: 133 mmol/L — ABNORMAL LOW (ref 135–145)
Total Bilirubin: 0.4 mg/dL (ref 0.3–1.2)
Total Protein: 7.6 g/dL (ref 6.5–8.1)

## 2023-02-06 LAB — RESP PANEL BY RT-PCR (RSV, FLU A&B, COVID)  RVPGX2
Influenza A by PCR: POSITIVE — AB
Influenza B by PCR: NEGATIVE
Resp Syncytial Virus by PCR: NEGATIVE
SARS Coronavirus 2 by RT PCR: NEGATIVE

## 2023-02-06 LAB — TROPONIN I (HIGH SENSITIVITY): Troponin I (High Sensitivity): 3 ng/L (ref <18)

## 2023-02-06 LAB — HIV ANTIBODY (ROUTINE TESTING W REFLEX): HIV Screen 4th Generation wRfx: NONREACTIVE

## 2023-02-06 LAB — D-DIMER, QUANTITATIVE: D-Dimer, Quant: 0.29 ug/mL-FEU (ref 0.00–0.50)

## 2023-02-06 MED ORDER — METHYLPREDNISOLONE SODIUM SUCC 125 MG IJ SOLR
80.0000 mg | INTRAMUSCULAR | Status: DC
  Administered 2023-02-07: 80 mg via INTRAVENOUS
  Filled 2023-02-06: qty 2

## 2023-02-06 MED ORDER — ALBUTEROL SULFATE HFA 108 (90 BASE) MCG/ACT IN AERS: 1.0000 | INHALATION_SPRAY | Freq: Four times a day (QID) | RESPIRATORY_TRACT | Status: DC | PRN

## 2023-02-06 MED ORDER — IPRATROPIUM-ALBUTEROL 0.5-2.5 (3) MG/3ML IN SOLN
3.0000 mL | Freq: Four times a day (QID) | RESPIRATORY_TRACT | Status: DC
  Administered 2023-02-06 – 2023-02-07 (×3): 3 mL via RESPIRATORY_TRACT
  Filled 2023-02-06 (×3): qty 3

## 2023-02-06 MED ORDER — METHYLPREDNISOLONE SODIUM SUCC 40 MG IJ SOLR: 40.0000 mg | Freq: Two times a day (BID) | INTRAMUSCULAR | Status: DC

## 2023-02-06 MED ORDER — POTASSIUM CHLORIDE CRYS ER 20 MEQ PO TBCR
40.0000 meq | EXTENDED_RELEASE_TABLET | Freq: Once | ORAL | Status: AC
  Administered 2023-02-06: 40 meq via ORAL

## 2023-02-06 MED ORDER — POTASSIUM CHLORIDE CRYS ER 20 MEQ PO TBCR
40.0000 meq | EXTENDED_RELEASE_TABLET | Freq: Once | ORAL | Status: AC
  Administered 2023-02-06: 40 meq via ORAL
  Filled 2023-02-06: qty 2

## 2023-02-06 MED ORDER — OSELTAMIVIR PHOSPHATE 75 MG PO CAPS
75.0000 mg | ORAL_CAPSULE | Freq: Two times a day (BID) | ORAL | Status: DC
  Administered 2023-02-06 – 2023-02-07 (×2): 75 mg via ORAL
  Filled 2023-02-06 (×2): qty 1

## 2023-02-06 MED ORDER — ALBUTEROL SULFATE (2.5 MG/3ML) 0.083% IN NEBU: 2.5000 mg | INHALATION_SOLUTION | Freq: Four times a day (QID) | RESPIRATORY_TRACT | Status: DC | PRN

## 2023-02-06 MED ORDER — OSELTAMIVIR PHOSPHATE 75 MG PO CAPS
75.0000 mg | ORAL_CAPSULE | Freq: Once | ORAL | Status: AC
  Administered 2023-02-06: 75 mg via ORAL
  Filled 2023-02-06: qty 1

## 2023-02-06 MED ORDER — ACETAMINOPHEN 500 MG PO TABS
1000.0000 mg | ORAL_TABLET | Freq: Once | ORAL | Status: AC
  Administered 2023-02-06: 1000 mg via ORAL
  Filled 2023-02-06: qty 2

## 2023-02-06 MED ORDER — IBUPROFEN 200 MG PO TABS: 600.0000 mg | ORAL_TABLET | Freq: Four times a day (QID) | ORAL | Status: DC | PRN

## 2023-02-06 MED ORDER — METHYLPREDNISOLONE SODIUM SUCC 125 MG IJ SOLR
125.0000 mg | Freq: Once | INTRAMUSCULAR | Status: AC
  Administered 2023-02-06: 125 mg via INTRAVENOUS
  Filled 2023-02-06: qty 2

## 2023-02-06 MED ORDER — MAGNESIUM SULFATE 2 GM/50ML IV SOLN
2.0000 g | Freq: Once | INTRAVENOUS | Status: AC
  Administered 2023-02-06: 2 g via INTRAVENOUS
  Filled 2023-02-06: qty 50

## 2023-02-06 MED ORDER — ALBUTEROL SULFATE (2.5 MG/3ML) 0.083% IN NEBU
10.0000 mg/h | INHALATION_SOLUTION | Freq: Once | RESPIRATORY_TRACT | Status: AC
  Administered 2023-02-06: 10 mg/h via RESPIRATORY_TRACT
  Filled 2023-02-06: qty 3

## 2023-02-06 MED ORDER — BUDESONIDE 0.25 MG/2ML IN SUSP
0.2500 mg | Freq: Two times a day (BID) | RESPIRATORY_TRACT | Status: DC
  Administered 2023-02-06 – 2023-02-07 (×2): 0.25 mg via RESPIRATORY_TRACT
  Filled 2023-02-06 (×2): qty 2

## 2023-02-06 NOTE — ED Notes (Signed)
Pt resting on stretcher NAD VSS will continue to monitor

## 2023-02-06 NOTE — ED Provider Notes (Signed)
[place] Provider Note   CSN: [id number] Arrival date & time: 02/06/23  1141     History  Chief Complaint  Patient presents with   Asthma    Margaret Terry is a 27 y.o. female.  HPI   27 year old female presenting to the emergency department with shortness of breath, fever, chills.  She has a history of asthma and feels like she is in acute exacerbation.  She denies any chest pain.  She is tolerating oral intake.  No abdominal pain, nausea or vomiting.  Home Medications Prior to Admission medications   Medication Sig Start Date End Date Taking? Authorizing Provider  albuterol (VENTOLIN HFA) 108 (90 Base) MCG/ACT inhaler Inhale 1-2 puffs into the lungs every 6 (six) hours as needed for wheezing or shortness of breath. Patient not taking: Reported on 01/30/2022 05/28/20   Bjorn Pippin, PA-C  albuterol (VENTOLIN HFA) 108 (90 Base) MCG/ACT inhaler Inhale 1-2 puffs into the lungs every 6 (six) hours as needed for wheezing or shortness of breath. 08/12/22   Mickie Hillier, PA-C  ibuprofen (ADVIL) 600 MG tablet Take 1 tablet (600 mg total) by mouth every 6 (six) hours as needed. Take with food 06/26/22   Crain, Loree Fee L, PA      Allergies    Grass extracts [gramineae pollens] and Penicillins    Review of Systems   Review of Systems  Constitutional:  Positive for chills, fatigue and fever.  Respiratory:  Positive for cough and shortness of breath.   All other systems reviewed and are negative.   Physical Exam Updated Vital Signs BP 111/65 (BP Location: Right Arm)   Pulse (!) 108   Temp (!) 101.9 F (38.8 C) (Oral)   Resp 13   SpO2 100%  Physical Exam Vitals and nursing note reviewed.  Constitutional:      General: She is not in acute distress.    Appearance: She is well-developed.  HENT:     Head: Normocephalic and atraumatic.  Eyes:     Conjunctiva/sclera: Conjunctivae normal.  Cardiovascular:     Rate and Rhythm:  Regular rhythm. Tachycardia present.  Pulmonary:     Effort: Pulmonary effort is normal. No respiratory distress.     Breath sounds: Wheezing present.     Comments: Diffuse wheezing all lung fields Abdominal:     Palpations: Abdomen is soft.     Tenderness: There is no abdominal tenderness.  Musculoskeletal:        General: No swelling.     Cervical back: Neck supple.  Skin:    General: Skin is warm and dry.     Capillary Refill: Capillary refill takes less than 2 seconds.  Neurological:     Mental Status: She is alert.  Psychiatric:        Mood and Affect: Mood normal.     ED Results / Procedures / Treatments   Labs (all labs ordered are listed, but only abnormal results are displayed) Labs Reviewed  RESP PANEL BY RT-PCR (RSV, FLU A&B, COVID)  RVPGX2 - Abnormal; Notable for the following components:      Result Value   Influenza A by PCR POSITIVE (*)    All other components within normal limits  CBC WITH DIFFERENTIAL/PLATELET - Abnormal; Notable for the following components:   RBC 5.34 (*)    MCV 75.5 (*)    MCH 24.0 (*)    RDW 17.8 (*)    All other components within normal  limits  COMPREHENSIVE METABOLIC PANEL - Abnormal; Notable for the following components:   Sodium 133 (*)    Potassium 2.9 (*)    CO2 18 (*)    Glucose, Bld 131 (*)    Creatinine, Ser 1.03 (*)    All other components within normal limits  D-DIMER, QUANTITATIVE  TROPONIN I (HIGH SENSITIVITY)    EKG EKG Interpretation  Date/Time:  Wednesday February 06 2023 11:50:26 EST Ventricular Rate:  128 PR Interval:  144 QRS Duration: 79 QT Interval:  290 QTC Calculation: 424 R Axis:   41 Text Interpretation: Sinus tachycardia Nonspecific T abnormalities, diffuse leads Confirmed by Regan Lemming ([id number]) on 02/06/2023 12:06:43 PM  Radiology DG Chest Portable 1 View  Result Date: 02/06/2023 CLINICAL DATA:  Shortness of breath. EXAM: PORTABLE CHEST 1 VIEW COMPARISON:  None Available. FINDINGS: Mild streaky  left basilar opacities with low lung volumes. No visible pleural effusions or pneumothorax. Cardiomediastinal silhouette is within normal limits for technique. IMPRESSION: Mild streaky left basilar opacities with low lung volumes, favor atelectasis. Electronically Signed   By: Margaretha Sheffield M.D.   On: 02/06/2023 12:19    Procedures Procedures    Medications Ordered in ED Medications  acetaminophen (TYLENOL) tablet 1,000 mg (1,000 mg Oral Given 02/06/23 1245)  albuterol (PROVENTIL) (2.5 MG/3ML) 0.083% nebulizer solution (10 mg/hr Nebulization Given 02/06/23 1245)  methylPREDNISolone sodium succinate (SOLU-MEDROL) 125 mg/2 mL injection 125 mg (125 mg Intravenous Given 02/06/23 1246)  magnesium sulfate IVPB 2 g 50 mL (0 g Intravenous Stopped 02/06/23 1312)    ED Course/ Medical Decision Making/ A&P Clinical Course as of 02/06/23 1603  Wed Feb 06, 2023  1425 Influenza A By PCR(!): POSITIVE [JL]    Clinical Course User Index [JL] Regan Lemming, MD                             Medical Decision Making Amount and/or Complexity of Data Reviewed Labs: ordered. Decision-making details documented in ED Course. Radiology: ordered.  Risk OTC drugs. Prescription drug management. Decision regarding hospitalization.     27 year old female presenting to the emergency department with shortness of breath, fever, chills.  She has a history of asthma and feels like she is in acute exacerbation.  She denies any chest pain.  She is tolerating oral intake.  No abdominal pain, nausea or vomiting.  On arrival, patient was febrile T11.9, tachycardic heart rate 140, RR 20, BP 111/65, saturating 100% on room air.  Sats did drop down to 89% on 1 measurement but subsequently improved.  Diffuse wheezing was noted on physical exam.  Concern for asthma exacerbation, viral infection, COVID-19, influenza, less likely pneumothorax or pneumonia, less likely PE.  The patient was administered DuoNebs x 3, Tylenol for  fever control, IV Solu-Medrol, IV magnesium.  Chest x-ray reveals no pneumothorax or pneumonia.  After evaluation revealed CBC with no leukocytosis or anemia, initial 23, CMP with hypokalemia to 2.9 while receiving albuterol, no AKI, D-dimer negative, PCR testing positive for influenza A.  Patient remains symptomatic, concern for asthma exacerbation and influenza, medicine consulted for admission for further breathing treatments in the setting of the patient's influenza infection.    Final Clinical Impression(s) / ED Diagnoses Final diagnoses:  Exacerbation of asthma, unspecified asthma severity, unspecified whether persistent  Influenza A    Rx / DC Orders ED Discharge Orders     None         Regan Lemming, MD 02/06/23  1603  

## 2023-02-06 NOTE — ED Notes (Signed)
ED TO INPATIENT HANDOFF REPORT  ED Nurse Name and Phone #: [phone]  S Name/Age/Gender Margaret Terry 27 y.o. female Room/Bed: 025C/025C  Code Status   Code Status: Full Code  Home/SNF/Other Home Patient oriented to: self, place, time, and situation Is this baseline? Yes   Triage Complete: Triage complete  Chief Complaint Asthma [J45.909]  Triage Note Margaret Terry is a 27 y.o. female arrived via ems for sob pt has hx of asthma reports that she felt same when dx with covid   Allergies Allergies  Allergen Reactions   Grass Extracts [Gramineae Pollens] Hives and Itching   Penicillins     Unknown reaction. Has patient had a PCN reaction causing immediate rash, facial/tongue/throat swelling, SOB or lightheadedness with hypotension: Unknown Has patient had a PCN reaction causing severe rash involving mucus membranes or skin necrosis: Unknown Has patient had a PCN reaction that required hospitalization: Unknown Has patient had a PCN reaction occurring within the last 10 years: Unknown If all of the above answers are "NO", then may proceed with Cephalosporin use.     Level of Care/Admitting Diagnosis ED Disposition     ED Disposition  Admit   Condition  --   Comment  Hospital Area: [facility] [[id number]]  Level of Care: Med-Surg [16]  May place patient in observation at [facility] or [place] if equivalent level of care is available:: No  Covid Evaluation: Asymptomatic - no recent exposure (last 10 days) testing not required  Diagnosis: Asthma [745110]  Admitting Physician: Lequita Halt [id number]  Attending Physician: Lequita Halt [id number]          B Medical/Surgery History Past Medical History:  Diagnosis Date   Asthma    Metabolic syndrome [id number]   From old records   Past Surgical History:  Procedure Laterality Date   ORIF TIBIA PLATEAU Right 01/31/2022   Procedure: OPEN REDUCTION INTERNAL FIXATION (ORIF) TIBIAL PLATEAU;   Surgeon: Shona Needles, MD;  Location: [facility];  Service: Orthopedics;  Laterality: Right;     A IV Location/Drains/Wounds Patient Lines/Drains/Airways Status     Active Line/Drains/Airways     Name Placement date Placement time Site Days   Peripheral IV 02/06/23 18 G Right Antecubital 02/06/23  1145  Antecubital  less than 1   Incision (Closed) 01/31/22 Leg Right 01/31/22  0919  -- 371   Wound / Incision (Open or Dehisced) 01/05/18 Incision - Open Shoulder Left;Upper 01/05/18  2222  Shoulder  1858   Wound / Incision (Open or Dehisced) 01/05/18 Incision - Open Neck Left 01/05/18  2222  Neck  1858            Intake/Output Last 24 hours No intake or output data in the 24 hours ending 02/06/23 2042  Labs/Imaging Results for orders placed or performed during the hospital encounter of 02/06/23 (from the past 48 hour(s))  Resp panel by RT-PCR (RSV, Flu A&B, Covid) Anterior Nasal Swab     Status: Abnormal   Collection Time: 02/06/23 11:56 AM   Specimen: Anterior Nasal Swab  Result Value Ref Range   SARS Coronavirus 2 by RT PCR NEGATIVE NEGATIVE   Influenza A by PCR POSITIVE (A) NEGATIVE   Influenza B by PCR NEGATIVE NEGATIVE    Comment: (NOTE) The Xpert Xpress SARS-CoV-2/FLU/RSV plus assay is intended as an aid in the diagnosis of influenza from Nasopharyngeal swab specimens and should not be used as a sole basis for treatment. Nasal washings and aspirates are unacceptable  for Xpert Xpress SARS-CoV-2/FLU/RSV testing.  Fact Sheet for Patients: EntrepreneurPulse.com.au  Fact Sheet for Healthcare Providers: IncredibleEmployment.be  This test is not yet approved or cleared by the [place] FDA and has been authorized for detection and/or diagnosis of SARS-CoV-2 by FDA under an Emergency Use Authorization (EUA). This EUA will remain in effect (meaning this test can be used) for the duration of the COVID-19 declaration under Section  564(b)(1) of the Act, 21 U.S.C. section 360bbb-3(b)(1), unless the authorization is terminated or revoked.     Resp Syncytial Virus by PCR NEGATIVE NEGATIVE    Comment: (NOTE) Fact Sheet for Patients: EntrepreneurPulse.com.au  Fact Sheet for Healthcare Providers: IncredibleEmployment.be  This test is not yet approved or cleared by the [place] FDA and has been authorized for detection and/or diagnosis of SARS-CoV-2 by FDA under an Emergency Use Authorization (EUA). This EUA will remain in effect (meaning this test can be used) for the duration of the COVID-19 declaration under Section 564(b)(1) of the Act, 21 U.S.C. section 360bbb-3(b)(1), unless the authorization is terminated or revoked.  Performed at [facility], [place] [address] [place], [place] [address]   CBC with Differential     Status: Abnormal   Collection Time: 02/06/23 12:07 PM  Result Value Ref Range   WBC 6.7 4.0 - 10.5 K/uL   RBC 5.34 (H) 3.87 - 5.11 MIL/uL   Hemoglobin 12.8 12.0 - 15.0 g/dL   HCT 40.3 36.0 - 46.0 %   MCV 75.5 (L) 80.0 - 100.0 fL   MCH 24.0 (L) 26.0 - 34.0 pg   MCHC 31.8 30.0 - 36.0 g/dL   RDW 17.8 (H) 11.5 - 15.5 %   Platelets 239 150 - 400 K/uL   nRBC 0.0 0.0 - 0.2 %   Neutrophils Relative % 56 %   Neutro Abs 3.8 1.7 - 7.7 K/uL   Lymphocytes Relative 30 %   Lymphs Abs 2.0 0.7 - 4.0 K/uL   Monocytes Relative 12 %   Monocytes Absolute 0.8 0.1 - 1.0 K/uL   Eosinophils Relative 2 %   Eosinophils Absolute 0.1 0.0 - 0.5 K/uL   Basophils Relative 0 %   Basophils Absolute 0.0 0.0 - 0.1 K/uL   Immature Granulocytes 0 %   Abs Immature Granulocytes 0.03 0.00 - 0.07 K/uL    Comment: Performed at [facility] [address], [place],  [address]  Comprehensive metabolic panel     Status: Abnormal   Collection Time: 02/06/23 12:07 PM  Result Value Ref Range   Sodium 133 (L) 135 - 145 mmol/L   Potassium 2.9 (L) 3.5 - 5.1 mmol/L    Chloride 102 98 - 111 mmol/L   CO2 18 (L) 22 - 32 mmol/L   Glucose, Bld 131 (H) 70 - 99 mg/dL    Comment: Glucose reference range applies only to samples taken after fasting for at least 8 hours.   BUN 6 6 - 20 mg/dL   Creatinine, Ser 1.03 (H) 0.44 - 1.00 mg/dL   Calcium 9.0 8.9 - 10.3 mg/dL   Total Protein 7.6 6.5 - 8.1 g/dL   Albumin 3.8 3.5 - 5.0 g/dL   AST 29 15 - 41 U/L   ALT 19 0 - 44 U/L   Alkaline Phosphatase 59 38 - 126 U/L   Total Bilirubin 0.4 0.3 - 1.2 mg/dL   GFR, Estimated >60 >60 mL/min    Comment: (NOTE) Calculated using the CKD-EPI Creatinine Equation (2021)    Anion  gap 13 5 - 15    Comment: Performed at [facility], [place] [address] [place], [place] [address]  D-dimer, quantitative     Status: None   Collection Time: 02/06/23 12:07 PM  Result Value Ref Range   D-Dimer, Quant 0.29 0.00 - 0.50 ug/mL-FEU    Comment: (NOTE) At the manufacturer cut-off value of 0.5 g/mL FEU, this assay has a negative predictive value of 95-100%.This assay is intended for use in conjunction with a clinical pretest probability (PTP) assessment model to exclude pulmonary embolism (PE) and deep venous thrombosis (DVT) in outpatients suspected of PE or DVT. Results should be correlated with clinical presentation. Performed at [facility], [place] [address], [place], [place] [address]   Troponin I (High Sensitivity)     Status: None   Collection Time: 02/06/23 12:07 PM  Result Value Ref Range   Troponin I (High Sensitivity) 3 <18 ng/L    Comment: (NOTE) Elevated high sensitivity troponin I (hsTnI) values and significant  changes across serial measurements may suggest ACS but many other  chronic and acute conditions are known to elevate hsTnI results.  Refer to the "Links" section for chest pain algorithms and additional  guidance. Performed at [facility], [place] [address], [place], [place] [address]    DG Chest Portable 1 View  Result Date: 02/06/2023 CLINICAL  DATA:  Shortness of breath. EXAM: PORTABLE CHEST 1 VIEW COMPARISON:  None Available. FINDINGS: Mild streaky left basilar opacities with low lung volumes. No visible pleural effusions or pneumothorax. Cardiomediastinal silhouette is within normal limits for technique. IMPRESSION: Mild streaky left basilar opacities with low lung volumes, favor atelectasis. Electronically Signed   By: Margaretha Sheffield M.D.   On: 02/06/2023 12:19    Pending Labs Unresulted Labs (From admission, onward)     Start     Ordered   02/07/23 [id number]  Basic metabolic panel  Tomorrow morning,   R        02/06/23 1821   02/06/23 1731  HIV Antibody (routine testing w rflx)  (HIV Antibody (Routine testing w reflex) panel)  Once,   R        02/06/23 1731            Vitals/Pain Today's Vitals   02/06/23 1615 02/06/23 1630 02/06/23 1645 02/06/23 1739  BP:      Pulse: 90 91 83   Resp: '14 16 11   '$ Temp:    98 F (36.7 C)  TempSrc:    Oral  SpO2: 99% 96% 99%   PainSc:        Isolation Precautions Airborne and Contact precautions  Medications Medications  ibuprofen (ADVIL) tablet 600 mg (has no administration in time range)  ipratropium-albuterol (DUONEB) 0.5-2.5 (3) MG/3ML nebulizer solution 3 mL (3 mLs Nebulization Given 02/06/23 2037)  budesonide (PULMICORT) nebulizer solution 0.25 mg (0.25 mg Nebulization Given 02/06/23 2037)  albuterol (PROVENTIL) (2.5 MG/3ML) 0.083% nebulizer solution 2.5 mg (has no administration in time range)  methylPREDNISolone sodium succinate (SOLU-MEDROL) 125 mg/2 mL injection 80 mg (has no administration in time range)  oseltamivir (TAMIFLU) capsule 75 mg (75 mg Oral Given 02/06/23 1835)  acetaminophen (TYLENOL) tablet 1,000 mg (1,000 mg Oral Given 02/06/23 1245)  albuterol (PROVENTIL) (2.5 MG/3ML) 0.083% nebulizer solution (10 mg/hr Nebulization Given 02/06/23 1245)  methylPREDNISolone sodium succinate (SOLU-MEDROL) 125 mg/2 mL injection 125 mg (125 mg Intravenous Given 02/06/23 1246)   magnesium sulfate IVPB 2 g 50 mL (0 g Intravenous Stopped  02/06/23 1312)  oseltamivir (TAMIFLU) capsule 75 mg (75 mg Oral Given 02/06/23 1659)  potassium chloride SA (KLOR-CON M) CR tablet 40 mEq (40 mEq Oral Given 02/06/23 1700)  potassium chloride SA (KLOR-CON M) CR tablet 40 mEq (40 mEq Oral Given 02/06/23 1815)    Mobility walks     Focused Assessments Pulmonary Assessment Handoff:  Lung sounds:   O2 Device: Room Air      R Recommendations: See Admitting Provider Note  Report given to:   Additional Notes: please call if you have any questions

## 2023-02-06 NOTE — ED Notes (Signed)
Pt appears to be feeling better NAD will continue to monitor

## 2023-02-06 NOTE — ED Triage Notes (Signed)
Margaret Terry is a 27 y.o. female arrived via ems for sob pt has hx of asthma reports that she felt same when dx with covid

## 2023-02-06 NOTE — ED Notes (Signed)
ED TO INPATIENT HANDOFF REPORT  ED Nurse Name and Phone #: Waynetta Sandy [phone]  S Name/Age/Gender Margaret Terry 27 y.o. female Room/Bed: 025C/025C  Code Status   Code Status: Full Code  Home/SNF/Other Home Patient oriented to: self, place, time, and situation Is this baseline? Yes   Triage Complete: Triage complete  Chief Complaint Asthma [J45.909]  Triage Note Margaret Terry is a 27 y.o. female arrived via ems for sob pt has hx of asthma reports that she felt same when dx with covid   Allergies Allergies  Allergen Reactions   Grass Extracts [Gramineae Pollens] Hives and Itching   Penicillins     Unknown reaction. Has patient had a PCN reaction causing immediate rash, facial/tongue/throat swelling, SOB or lightheadedness with hypotension: Unknown Has patient had a PCN reaction causing severe rash involving mucus membranes or skin necrosis: Unknown Has patient had a PCN reaction that required hospitalization: Unknown Has patient had a PCN reaction occurring within the last 10 years: Unknown If all of the above answers are "NO", then may proceed with Cephalosporin use.     Level of Care/Admitting Diagnosis ED Disposition     ED Disposition  Admit   Condition  --   Comment  Hospital Area: [facility] [[id number]]  Level of Care: Med-Surg [16]  May place patient in [facility] at [facility] or [facility] if equivalent level of care is available:: No  Covid Evaluation: Asymptomatic - no recent exposure (last 10 days) testing not required  Diagnosis: Asthma [745110]  Admitting Physician: Lequita Halt [id number]  Attending Physician: Lequita Halt [id number]          B Medical/Surgery History Past Medical History:  Diagnosis Date   Asthma    Metabolic syndrome [id number]   From old records   Past Surgical History:  Procedure Laterality Date   ORIF TIBIA PLATEAU Right 01/31/2022   Procedure: OPEN REDUCTION INTERNAL FIXATION (ORIF) TIBIAL  PLATEAU;  Surgeon: Shona Needles, MD;  Location: [facility];  Service: Orthopedics;  Laterality: Right;     A IV Location/Drains/Wounds Patient Lines/Drains/Airways Status     Active Line/Drains/Airways     Name Placement date Placement time Site Days   Peripheral IV 02/06/23 18 G Right Antecubital 02/06/23  1145  Antecubital  less than 1   Incision (Closed) 01/31/22 Leg Right 01/31/22  0919  -- 371   Wound / Incision (Open or Dehisced) 01/05/18 Incision - Open Shoulder Left;Upper 01/05/18  2222  Shoulder  1858   Wound / Incision (Open or Dehisced) 01/05/18 Incision - Open Neck Left 01/05/18  2222  Neck  1858            Intake/Output Last 24 hours No intake or output data in the 24 hours ending 02/06/23 1741  Labs/Imaging Results for orders placed or performed during the hospital encounter of 02/06/23 (from the past 48 hour(s))  Resp panel by RT-PCR (RSV, Flu A&B, Covid) Anterior Nasal Swab     Status: Abnormal   Collection Time: 02/06/23 11:56 AM   Specimen: Anterior Nasal Swab  Result Value Ref Range   SARS Coronavirus 2 by RT PCR NEGATIVE NEGATIVE   Influenza A by PCR POSITIVE (A) NEGATIVE   Influenza B by PCR NEGATIVE NEGATIVE    Comment: (NOTE) The Xpert Xpress SARS-CoV-2/FLU/RSV plus assay is intended as an aid in the diagnosis of influenza from Nasopharyngeal swab specimens and should not be used as a sole basis for treatment. Nasal washings and aspirates  are unacceptable for Xpert Xpress SARS-CoV-2/FLU/RSV testing.  Fact Sheet for Patients: EntrepreneurPulse.com.au  Fact Sheet for Healthcare Providers: IncredibleEmployment.be  This test is not yet approved or cleared by the [place] FDA and has been authorized for detection and/or diagnosis of SARS-CoV-2 by FDA under an Emergency Use Authorization (EUA). This EUA will remain in effect (meaning this test can be used) for the duration of the COVID-19 declaration under  Section 564(b)(1) of the Act, 21 U.S.C. section 360bbb-3(b)(1), unless the authorization is terminated or revoked.     Resp Syncytial Virus by PCR NEGATIVE NEGATIVE    Comment: (NOTE) Fact Sheet for Patients: EntrepreneurPulse.com.au  Fact Sheet for Healthcare Providers: IncredibleEmployment.be  This test is not yet approved or cleared by the [place] FDA and has been authorized for detection and/or diagnosis of SARS-CoV-2 by FDA under an Emergency Use Authorization (EUA). This EUA will remain in effect (meaning this test can be used) for the duration of the COVID-19 declaration under Section 564(b)(1) of the Act, 21 U.S.C. section 360bbb-3(b)(1), unless the authorization is terminated or revoked.  Performed at [facility], [place] [address] [place], [place] [address]   CBC with Differential     Status: Abnormal   Collection Time: 02/06/23 12:07 PM  Result Value Ref Range   WBC 6.7 4.0 - 10.5 K/uL   RBC 5.34 (H) 3.87 - 5.11 MIL/uL   Hemoglobin 12.8 12.0 - 15.0 g/dL   HCT 40.3 36.0 - 46.0 %   MCV 75.5 (L) 80.0 - 100.0 fL   MCH 24.0 (L) 26.0 - 34.0 pg   MCHC 31.8 30.0 - 36.0 g/dL   RDW 17.8 (H) 11.5 - 15.5 %   Platelets 239 150 - 400 K/uL   nRBC 0.0 0.0 - 0.2 %   Neutrophils Relative % 56 %   Neutro Abs 3.8 1.7 - 7.7 K/uL   Lymphocytes Relative 30 %   Lymphs Abs 2.0 0.7 - 4.0 K/uL   Monocytes Relative 12 %   Monocytes Absolute 0.8 0.1 - 1.0 K/uL   Eosinophils Relative 2 %   Eosinophils Absolute 0.1 0.0 - 0.5 K/uL   Basophils Relative 0 %   Basophils Absolute 0.0 0.0 - 0.1 K/uL   Immature Granulocytes 0 %   Abs Immature Granulocytes 0.03 0.00 - 0.07 K/uL    Comment: Performed at [facility] [address] [place], [place] [address]  Comprehensive metabolic panel     Status: Abnormal   Collection Time: 02/06/23 12:07 PM  Result Value Ref Range   Sodium 133 (L) 135 - 145 mmol/L   Potassium 2.9 (L) 3.5 - 5.1 mmol/L    Chloride 102 98 - 111 mmol/L   CO2 18 (L) 22 - 32 mmol/L   Glucose, Bld 131 (H) 70 - 99 mg/dL    Comment: Glucose reference range applies only to samples taken after fasting for at least 8 hours.   BUN 6 6 - 20 mg/dL   Creatinine, Ser 1.03 (H) 0.44 - 1.00 mg/dL   Calcium 9.0 8.9 - 10.3 mg/dL   Total Protein 7.6 6.5 - 8.1 g/dL   Albumin 3.8 3.5 - 5.0 g/dL   AST 29 15 - 41 U/L   ALT 19 0 - 44 U/L   Alkaline Phosphatase 59 38 - 126 U/L   Total Bilirubin 0.4 0.3 - 1.2 mg/dL   GFR, Estimated >60 >60 mL/min    Comment: (NOTE) Calculated using the CKD-EPI Creatinine Equation (2021)  Anion gap 13 5 - 15    Comment: Performed at [facility] [address] [place], [place] [address]  D-dimer, quantitative     Status: None   Collection Time: 02/06/23 12:07 PM  Result Value Ref Range   D-Dimer, Quant 0.29 0.00 - 0.50 ug/mL-FEU    Comment: (NOTE) At the manufacturer cut-off value of 0.5 g/mL FEU, this assay has a negative predictive value of 95-100%.This assay is intended for use in conjunction with a clinical pretest probability (PTP) assessment model to exclude pulmonary embolism (PE) and deep venous thrombosis (DVT) in outpatients suspected of PE or DVT. Results should be correlated with clinical presentation. Performed at [facility], [place] [address], [place], [place] [address]   Troponin I (High Sensitivity)     Status: None   Collection Time: 02/06/23 12:07 PM  Result Value Ref Range   Troponin I (High Sensitivity) 3 <18 ng/L    Comment: (NOTE) Elevated high sensitivity troponin I (hsTnI) values and significant  changes across serial measurements may suggest ACS but many other  chronic and acute conditions are known to elevate hsTnI results.  Refer to the "Links" section for chest pain algorithms and additional  guidance. Performed at [facility], [place] [address] [place], [place] [address]    DG Chest Portable 1 View  Result Date:  02/06/2023 CLINICAL DATA:  Shortness of breath. EXAM: PORTABLE CHEST 1 VIEW COMPARISON:  None Available. FINDINGS: Mild streaky left basilar opacities with low lung volumes. No visible pleural effusions or pneumothorax. Cardiomediastinal silhouette is within normal limits for technique. IMPRESSION: Mild streaky left basilar opacities with low lung volumes, favor atelectasis. Electronically Signed   By: Margaretha Sheffield M.D.   On: 02/06/2023 12:19    Pending Labs Unresulted Labs (From admission, onward)     Start     Ordered   02/06/23 1731  HIV Antibody (routine testing w rflx)  (HIV Antibody (Routine testing w reflex) panel)  Once,   R        02/06/23 1731            Vitals/Pain Today's Vitals   02/06/23 1615 02/06/23 1630 02/06/23 1645 02/06/23 1739  BP:      Pulse: 90 91 83   Resp: '14 16 11   '$ Temp:    98 F (36.7 C)  TempSrc:    Oral  SpO2: 99% 96% 99%   PainSc:        Isolation Precautions Airborne and Contact precautions  Medications Medications  ibuprofen (ADVIL) tablet 600 mg (has no administration in time range)  albuterol (VENTOLIN HFA) 108 (90 Base) MCG/ACT inhaler 1-2 puff (has no administration in time range)  ipratropium-albuterol (DUONEB) 0.5-2.5 (3) MG/3ML nebulizer solution 3 mL (has no administration in time range)  budesonide (PULMICORT) nebulizer solution 0.25 mg (has no administration in time range)  methylPREDNISolone sodium succinate (SOLU-MEDROL) 40 mg/mL injection 40 mg (40 mg Intravenous Not Given 02/06/23 1740)  acetaminophen (TYLENOL) tablet 1,000 mg (1,000 mg Oral Given 02/06/23 1245)  albuterol (PROVENTIL) (2.5 MG/3ML) 0.083% nebulizer solution (10 mg/hr Nebulization Given 02/06/23 1245)  methylPREDNISolone sodium succinate (SOLU-MEDROL) 125 mg/2 mL injection 125 mg (125 mg Intravenous Given 02/06/23 1246)  magnesium sulfate IVPB 2 g 50 mL (0 g Intravenous Stopped 02/06/23 1312)  oseltamivir (TAMIFLU) capsule 75 mg (75 mg Oral Given 02/06/23 1659)   potassium chloride SA (KLOR-CON M) CR tablet 40 mEq (40 mEq Oral Given 02/06/23 1700)  Mobility walks     Focused Assessments Pulmonary Assessment Handoff:  Lung sounds:   O2 Device: Room Air      R Recommendations: See Admitting Provider Note  Report given to:   Additional Notes: .

## 2023-02-06 NOTE — H&P (Signed)
History and Physical    Margaret Terry [id number] DOB: 07/08/1996 DOA: 02/06/2023  PCP: Pcp, No (Confirm with patient/family/NH records and if not entered, this has to be entered at [facility] point of entry) Patient coming from: Home  I have personally briefly reviewed patient's old medical records in [place]  Chief Complaint: Cough, wheezing, SOB  HPI: Margaret Terry is a 27 y.o. female with medical history significant of mild intermittent asthma, obesity presented with wheezing cough shortness of breath.   Symptoms started 2 days ago with new onset of dry cough wheezing and increasing exertional dyspnea.  Accompanied with episode of chills but no fever at home.  Denies any chest pain, no abdominal pain no diarrhea. No Hx of intubation.   ED Course:  Patient was found febrile 101.9, tachycardia heart rate in 140s, O2 saturation at 1% on room air but very tachypneic.  In the ED, patient was given multiple rounds of DuoNebs, IV Solu-Medrol and IV magnesium and her breathing symptoms slightly improved but continued to be tachypneic.  Review of Systems: As per HPI otherwise 14 point review of systems negative.    Past Medical History:  Diagnosis Date   Asthma    Metabolic syndrome [id number]   From old records    Past Surgical History:  Procedure Laterality Date   ORIF TIBIA PLATEAU Right 01/31/2022   Procedure: OPEN REDUCTION INTERNAL FIXATION (ORIF) TIBIAL PLATEAU;  Surgeon: Shona Needles, MD;  Location: [place];  Service: Orthopedics;  Laterality: Right;     reports that she has been smoking cigars. She has never used smokeless tobacco. She reports current drug use. Frequency: 7.00 times per week. Drug: Marijuana. She reports that she does not drink alcohol.  Allergies  Allergen Reactions   Grass Extracts [Gramineae Pollens] Hives and Itching   Penicillins     Unknown reaction. Has patient had a PCN reaction causing immediate rash, facial/tongue/throat swelling, SOB or  lightheadedness with hypotension: Unknown Has patient had a PCN reaction causing severe rash involving mucus membranes or skin necrosis: Unknown Has patient had a PCN reaction that required hospitalization: Unknown Has patient had a PCN reaction occurring within the last 10 years: Unknown If all of the above answers are "NO", then may proceed with Cephalosporin use.     Family History  Problem Relation Age of Onset   Healthy Mother     Prior to Admission medications   Medication Sig Start Date End Date Taking? Authorizing Provider  albuterol (VENTOLIN HFA) 108 (90 Base) MCG/ACT inhaler Inhale 1-2 puffs into the lungs every 6 (six) hours as needed for wheezing or shortness of breath. Patient not taking: Reported on 01/30/2022 05/28/20   Bjorn Pippin, PA-C  albuterol (VENTOLIN HFA) 108 (90 Base) MCG/ACT inhaler Inhale 1-2 puffs into the lungs every 6 (six) hours as needed for wheezing or shortness of breath. 08/12/22   Mickie Hillier, PA-C  ibuprofen (ADVIL) 600 MG tablet Take 1 tablet (600 mg total) by mouth every 6 (six) hours as needed. Take with food 06/26/22   Chaney Malling, [place]    Physical Exam: Vitals:   02/06/23 1615 02/06/23 1630 02/06/23 1645 02/06/23 1739  BP:      Pulse: 90 91 83   Resp: '14 16 11   '$ Temp:    98 F (36.7 C)  TempSrc:    Oral  SpO2: 99% 96% 99%     Constitutional: NAD, calm, comfortable Vitals:   02/06/23 1615 02/06/23 1630 02/06/23 1645  02/06/23 1739  BP:      Pulse: 90 91 83   Resp: '14 16 11   '$ Temp:    98 F (36.7 C)  TempSrc:    Oral  SpO2: 99% 96% 99%    Eyes: PERRL, lids and conjunctivae normal ENMT: Mucous membranes are moist. Posterior pharynx clear of any exudate or lesions.Normal dentition.  Neck: normal, supple, no masses, no thyromegaly Respiratory: Diminished breathing bilaterally, diffused wheezing, no crackles, increasing breathing effort, No accessory muscle use.  Cardiovascular: Regular rate and rhythm, no murmurs / rubs /  gallops. No extremity edema. 2+ pedal pulses. No carotid bruits.  Abdomen: no tenderness, no masses palpated. No hepatosplenomegaly. Bowel sounds positive.  Musculoskeletal: no clubbing / cyanosis. No joint deformity upper and lower extremities. Good ROM, no contractures. Normal muscle tone.  Skin: no rashes, lesions, ulcers. No induration Neurologic: CN 2-12 grossly intact. Sensation intact, DTR normal. Strength 5/5 in all 4.  Psychiatric: Normal judgment and insight. Alert and oriented x 3. Normal mood.     Labs on Admission: I have personally reviewed following labs and imaging studies  CBC: Recent Labs  Lab 02/06/23 1207  WBC 6.7  NEUTROABS 3.8  HGB 12.8  HCT 40.3  MCV 75.5*  PLT [id number]   Basic Metabolic Panel: Recent Labs  Lab 02/06/23 1207  NA 133*  K 2.9*  CL 102  CO2 18*  GLUCOSE 131*  BUN 6  CREATININE 1.03*  CALCIUM 9.0   GFR: CrCl cannot be calculated (Unknown ideal weight.). Liver Function Tests: Recent Labs  Lab 02/06/23 1207  AST 29  ALT 19  ALKPHOS 59  BILITOT 0.4  PROT 7.6  ALBUMIN 3.8   No results for input(s): "LIPASE", "AMYLASE" in the last 168 hours. No results for input(s): "AMMONIA" in the last 168 hours. Coagulation Profile: No results for input(s): "INR", "PROTIME" in the last 168 hours. Cardiac Enzymes: No results for input(s): "CKTOTAL", "CKMB", "CKMBINDEX", "TROPONINI" in the last 168 hours. BNP (last 3 results) No results for input(s): "PROBNP" in the last 8760 hours. HbA1C: No results for input(s): "HGBA1C" in the last 72 hours. CBG: No results for input(s): "GLUCAP" in the last 168 hours. Lipid Profile: No results for input(s): "CHOL", "HDL", "LDLCALC", "TRIG", "CHOLHDL", "LDLDIRECT" in the last 72 hours. Thyroid Function Tests: No results for input(s): "TSH", "T4TOTAL", "FREET4", "T3FREE", "THYROIDAB" in the last 72 hours. Anemia Panel: No results for input(s): "VITAMINB12", "FOLATE", "FERRITIN", "TIBC", "IRON", "RETICCTPCT"  in the last 72 hours. Urine analysis:    Component Value Date/Time   COLORURINE YELLOW 08/01/2019 2011   APPEARANCEUR CLOUDY (A) 08/01/2019 2011   LABSPEC 1.014 08/01/2019 2011   PHURINE 6.0 08/01/2019 2011   GLUCOSEU NEGATIVE 08/01/2019 2011   HGBUR MODERATE (A) 08/01/2019 2011   BILIRUBINUR NEGATIVE 08/01/2019 2011   BILIRUBINUR neg 12/29/2014 1527   KETONESUR 20 (A) 08/01/2019 2011   PROTEINUR 100 (A) 08/01/2019 2011   UROBILINOGEN negative 12/29/2014 1527   NITRITE POSITIVE (A) 08/01/2019 2011   LEUKOCYTESUR LARGE (A) 08/01/2019 2011    Radiological Exams on Admission: DG Chest Portable 1 View  Result Date: 02/06/2023 CLINICAL DATA:  Shortness of breath. EXAM: PORTABLE CHEST 1 VIEW COMPARISON:  None Available. FINDINGS: Mild streaky left basilar opacities with low lung volumes. No visible pleural effusions or pneumothorax. Cardiomediastinal silhouette is within normal limits for technique. IMPRESSION: Mild streaky left basilar opacities with low lung volumes, favor atelectasis. Electronically Signed   By: Margaretha Sheffield M.D.   On: 02/06/2023  12:19    EKG: Independently reviewed.  Sinus tachycardia, no acute ST changes.  Assessment/Plan Principal Problem:   Asthma Active Problems:   Influenza A virus present   Asthma, chronic obstructive, with acute exacerbation ([place])  (please populate well all problems here in Problem List. (For example, if patient is on BP meds at home and you resume or decide to hold them, it is a problem that needs to be her. Same for CAD, COPD, HLD and so on)  Acute asthma exacerbation with impending respiratory failure -Likely triggered by Influenza A infection. Still significantly symptomatic after the ED management, will continue IV Solu-Medrol, every 6 hours DuoNebs and as needed albuterol -Add ICS BID -Peak flow and incentive spirometry  Sepsis -Secondary to influenza A infection, sepsis evidenced by tachycardia pneumocele fever -ED started  Tamiflu, will continue for another 5 days -Patient is euvolemic, will hold off IV fluid  Hypokalemia -PO replacement -Also received IV Mg for asthma  Hyponatremia -euvolemic, mild, probably related to her breathing symptoms -Treat asthma, recheck Na level tomorrow  Obesity -BMI=31, recommend calorie control  DVT prophylaxis: SCD Code Status: Full code Family Communication: None at bedside Disposition Plan: Expect less than 2 midnight hospital stay Consults called: None Admission status: MedSurg observation   Lequita Halt MD [facility] Pager [phone]  02/06/2023, 6:13 PM

## 2023-02-06 NOTE — ED Provider Notes (Signed)
Case discussed with Dr Roosevelt Locks regarding admission   Margaret Rank, MD 02/06/23 [phone]

## 2023-02-07 ENCOUNTER — Other Ambulatory Visit (HOSPITAL_COMMUNITY): Payer: Self-pay

## 2023-02-07 DIAGNOSIS — J45909 Unspecified asthma, uncomplicated: Secondary | ICD-10-CM | POA: Diagnosis not present

## 2023-02-07 DIAGNOSIS — J101 Influenza due to other identified influenza virus with other respiratory manifestations: Secondary | ICD-10-CM | POA: Diagnosis not present

## 2023-02-07 DIAGNOSIS — J45901 Unspecified asthma with (acute) exacerbation: Secondary | ICD-10-CM | POA: Diagnosis not present

## 2023-02-07 LAB — BASIC METABOLIC PANEL
Anion gap: 8 (ref 5–15)
BUN: 10 mg/dL (ref 6–20)
CO2: 24 mmol/L (ref 22–32)
Calcium: 9.2 mg/dL (ref 8.9–10.3)
Chloride: 103 mmol/L (ref 98–111)
Creatinine, Ser: 0.83 mg/dL (ref 0.44–1.00)
GFR, Estimated: >60 mL/min (ref >60)
Glucose, Bld: 115 mg/dL — ABNORMAL HIGH (ref 70–99)
Potassium: 4.2 mmol/L (ref 3.5–5.1)
Sodium: 135 mmol/L (ref 135–145)

## 2023-02-07 MED ORDER — ALBUTEROL SULFATE (2.5 MG/3ML) 0.083% IN NEBU: 2.5000 mg | INHALATION_SOLUTION | Freq: Four times a day (QID) | RESPIRATORY_TRACT | 1 refills | Status: DC | PRN

## 2023-02-07 MED ORDER — OSELTAMIVIR PHOSPHATE 75 MG PO CAPS
75.0000 mg | ORAL_CAPSULE | Freq: Two times a day (BID) | ORAL | 0 refills | Status: DC
  Filled 2023-02-07: qty 14, 7d supply, fill #0

## 2023-02-07 MED ORDER — ALBUTEROL SULFATE HFA 108 (90 BASE) MCG/ACT IN AERS
1.0000 | INHALATION_SPRAY | Freq: Four times a day (QID) | RESPIRATORY_TRACT | 1 refills | Status: AC | PRN
  Filled 2023-02-07: qty 6.7, 25d supply, fill #0

## 2023-02-07 MED ORDER — FLUTICASONE PROPIONATE HFA 44 MCG/ACT IN AERO
1.0000 | INHALATION_SPRAY | Freq: Two times a day (BID) | RESPIRATORY_TRACT | 0 refills | Status: DC
  Filled 2023-02-07: qty 10.6, 30d supply, fill #0

## 2023-02-07 MED ORDER — FLUTICASONE PROPIONATE HFA 44 MCG/ACT IN AERO: 1.0000 | INHALATION_SPRAY | Freq: Two times a day (BID) | RESPIRATORY_TRACT | 0 refills | Status: DC

## 2023-02-07 MED ORDER — OSELTAMIVIR PHOSPHATE 75 MG PO CAPS: 75.0000 mg | ORAL_CAPSULE | Freq: Two times a day (BID) | ORAL | 0 refills | Status: AC

## 2023-02-07 MED ORDER — PREDNISONE 20 MG PO TABS
40.0000 mg | ORAL_TABLET | Freq: Every day | ORAL | 0 refills | Status: DC
  Filled 2023-02-07: qty 6, 3d supply, fill #0

## 2023-02-07 MED ORDER — PREDNISONE 20 MG PO TABS: 40.0000 mg | ORAL_TABLET | Freq: Every day | ORAL | 0 refills | Status: AC

## 2023-02-07 MED ORDER — ALBUTEROL SULFATE (2.5 MG/3ML) 0.083% IN NEBU
2.5000 mg | INHALATION_SOLUTION | Freq: Four times a day (QID) | RESPIRATORY_TRACT | 1 refills | Status: DC | PRN
  Filled 2023-02-07: qty 90, 8d supply, fill #0

## 2023-02-07 NOTE — Discharge Summary (Signed)
Physician Discharge Summary  Margaret Terry [id number] DOB: November 15, 1996 DOA: 02/06/2023  PCP: Pcp, No  Admit date: 02/06/2023 Discharge date: 02/07/2023  Time spent: 40 minutes  Recommendations for Outpatient Follow-up:  Follow outpatient CBC/CMP  Follow with PCP outpatient - controller med adjustment as needed for asthma Follow repeat CXR outpatient Follow PFT's outpatient    Discharge Diagnoses:  Principal Problem:   Asthma Active Problems:   Influenza Margaret Terry virus present   Asthma, chronic obstructive, with acute exacerbation ([place])   Discharge Condition: stable  Diet recommendation: heart healthy  There were no vitals filed for this visit.  History of present illness:   Margaret Terry is Margaret Terry 27 y.o. female with medical history significant of mild intermittent asthma, obesity presented with wheezing cough shortness of breath.     Symptoms started 2 days ago with new onset of dry cough wheezing and increasing exertional dyspnea.  Accompanied with episode of chills but no fever at home.  Denies any chest pain, no abdominal pain no diarrhea. No Hx of intubation.    Stable for discharge on 3/7.  Improved with treatment of asthma exacerbation and flu.  Hospital Course:  Assessment and Plan:  Acute asthma exacerbation with impending respiratory failure -Likely triggered by Influenza Margaret Terry infection -stable for discharge 3/7 -will discharge with flovent for controller med.  Refill albuterol.  Ask TOC to help with nebulizer.  She needs PCP as well, will ask about referral.  Stable for discharge with albuterol, flovent, prednisone, tamiflu.   Sepsis -Secondary to influenza Margaret Terry infection, sepsis evidenced by tachycardia, fever, tachypnea -ED started Tamiflu, will continue for another 5 days   Hypokalemia resolved   Hyponatremia resolved   Obesity There is no height or weight on file to calculate BMI.    Procedures: none   Consultations: none  Discharge Exam: Vitals:    02/07/23 0805 02/07/23 0840  BP: 120/78   Pulse: 85 87  Resp: 18 18  Temp: 98 F (36.7 C)   SpO2: 98% 99%   No complaints 3 hospital visits in past year for asthma Feels well to go home, asking about cough  General: No acute distress. Cardiovascular: Heart sounds show Margaret Terry regular rate, and rhythm. Lungs: Clear to auscultation bilaterally with good air movement.  Abdomen: Soft, nontender, nondistended  Neurological: Alert and oriented 3. Moves all extremities 4 with equal strength. Cranial nerves II through XII grossly intact. Extremities: No clubbing or cyanosis. No edema.   Discharge Instructions   Discharge Instructions     Call MD for:  difficulty breathing, headache or visual disturbances   Complete by: As directed    Call MD for:  extreme fatigue   Complete by: As directed    Call MD for:  hives   Complete by: As directed    Call MD for:  persistant dizziness or light-headedness   Complete by: As directed    Call MD for:  persistant nausea and vomiting   Complete by: As directed    Call MD for:  redness, tenderness, or signs of infection (pain, swelling, redness, odor or green/yellow discharge around incision site)   Complete by: As directed    Call MD for:  severe uncontrolled pain   Complete by: As directed    Call MD for:  temperature >100.4   Complete by: As directed    Diet - low sodium heart healthy   Complete by: As directed    Discharge instructions   Complete by: As directed    You  were seen for an asthma exacerbation due to the flu.  You've improved with tamiflu (an antiviral for the flu), steroids, and breathing treatments.  We'll refill your albuterol inhalers.  I'll also see about getting Margaret Terry nebulizer for you and albuterol medicine for the nebulizer.  These are the same medicine so use one or the other.    I'll send you with flovent to use as Margaret Terry controller medicine.  Use this daily.  Follow up and establish with Margaret Terry PCP to adjust the dose of your  controller medicine and make Margaret Terry plan for management of your asthma.  We'll send you with another few days of steroids as well.  Return for new, recurrent, or worsening symptoms.  Please ask your PCP to request records from this hospitalization so they know what was done and what the next steps will be.   Increase activity slowly   Complete by: As directed       Allergies as of 02/07/2023       Reactions   Grass Extracts [gramineae Pollens] Hives, Itching   Penicillins    Unknown reaction. Has patient had Margaret Terry PCN reaction causing immediate rash, facial/tongue/throat swelling, SOB or lightheadedness with hypotension: Unknown Has patient had Margaret Terry PCN reaction causing severe rash involving mucus membranes or skin necrosis: Unknown Has patient had Margaret Terry PCN reaction that required hospitalization: Unknown Has patient had Margaret Terry PCN reaction occurring within the last 10 years: Unknown If all of the above answers are "NO", then may proceed with Cephalosporin use.        Medication List     STOP taking these medications    albuterol 108 (90 Base) MCG/ACT inhaler Commonly known as: VENTOLIN HFA Replaced by: albuterol (2.5 MG/3ML) 0.083% nebulizer solution You also have another medication with the same name that you need to continue taking as instructed.       TAKE these medications    albuterol 108 (90 Base) MCG/ACT inhaler Commonly known as: VENTOLIN HFA Inhale 1-2 puffs into the lungs every 6 (six) hours as needed for wheezing or shortness of breath. What changed:  Another medication with the same name was added. Make sure you understand how and when to take each. Another medication with the same name was removed. Continue taking this medication, and follow the directions you see here.   albuterol (2.5 MG/3ML) 0.083% nebulizer solution Commonly known as: PROVENTIL Take 3 mLs (2.5 mg total) by nebulization every 6 (six) hours as needed for shortness of breath or wheezing. What changed: You  were already taking Margaret Terry medication with the same name, and this prescription was added. Make sure you understand how and when to take each. Replaces: albuterol 108 (90 Base) MCG/ACT inhaler   fluticasone 110 MCG/ACT inhaler Commonly known as: FLOVENT HFA Inhale 1 puff into the lungs 2 (two) times daily.   ibuprofen 600 MG tablet Commonly known as: ADVIL Take 1 tablet (600 mg total) by mouth every 6 (six) hours as needed. Take with food   NYQUIL COUGH DM + CONGESTION PO Take 7 mLs by mouth at bedtime as needed (cough and congestion).   oseltamivir 75 MG capsule Commonly known as: TAMIFLU Take 1 capsule (75 mg total) by mouth 2 (two) times daily for 8 days.   predniSONE 20 MG tablet Commonly known as: DELTASONE Take 2 tablets (40 mg total) by mouth daily for 3 days. Start taking on: February 08, 2023       Allergies  Allergen Reactions   Grass Extracts Roosvelt Maser  Pollens] Hives and Itching   Penicillins     Unknown reaction. Has patient had Alba Perillo PCN reaction causing immediate rash, facial/tongue/throat swelling, SOB or lightheadedness with hypotension: Unknown Has patient had Alesia Oshields PCN reaction causing severe rash involving mucus membranes or skin necrosis: Unknown Has patient had Saharah Sherrow PCN reaction that required hospitalization: Unknown Has patient had Jhania Etherington PCN reaction occurring within the last 10 years: Unknown If all of the above answers are "NO", then may proceed with Cephalosporin use.       The results of significant diagnostics from this hospitalization (including imaging, microbiology, ancillary and laboratory) are listed below for reference.    Significant Diagnostic Studies: DG Chest Portable 1 View  Result Date: 02/06/2023 CLINICAL DATA:  Shortness of breath. EXAM: PORTABLE CHEST 1 VIEW COMPARISON:  None Available. FINDINGS: Mild streaky left basilar opacities with low lung volumes. No visible pleural effusions or pneumothorax. Cardiomediastinal silhouette is within normal limits  for technique. IMPRESSION: Mild streaky left basilar opacities with low lung volumes, favor atelectasis. Electronically Signed   By: Margaretha Sheffield M.D.   On: 02/06/2023 12:19    Microbiology: Recent Results (from the past 240 hour(s))  Resp panel by RT-PCR (RSV, Flu Aline Wesche&B, Covid) Anterior Nasal Swab     Status: Abnormal   Collection Time: 02/06/23 11:56 AM   Specimen: Anterior Nasal Swab  Result Value Ref Range Status   SARS Coronavirus 2 by RT PCR NEGATIVE NEGATIVE Final   Influenza Blu Mcglaun by PCR POSITIVE ([place]) NEGATIVE Final   Influenza B by PCR NEGATIVE NEGATIVE Final    Comment: (NOTE) The Xpert Xpress SARS-CoV-2/FLU/RSV plus assay is intended as an aid in the diagnosis of influenza from Nasopharyngeal swab specimens and should not be used as Jasmarie Coppock sole basis for treatment. Nasal washings and aspirates are unacceptable for Xpert Xpress SARS-CoV-2/FLU/RSV testing.  Fact Sheet for Patients: EntrepreneurPulse.com.au  Fact Sheet for Healthcare Providers: IncredibleEmployment.be  This test is not yet approved or cleared by the [place] FDA and has been authorized for detection and/or diagnosis of SARS-CoV-2 by FDA under an Emergency Use Authorization (EUA). This EUA will remain in effect (meaning this test can be used) for the duration of the COVID-19 declaration under Section 564(b)(1) of the Act, 21 U.S.C. section 360bbb-3(b)(1), unless the authorization is terminated or revoked.     Resp Syncytial Virus by PCR NEGATIVE NEGATIVE Final    Comment: (NOTE) Fact Sheet for Patients: EntrepreneurPulse.com.au  Fact Sheet for Healthcare Providers: IncredibleEmployment.be  This test is not yet approved or cleared by the [place] FDA and has been authorized for detection and/or diagnosis of SARS-CoV-2 by FDA under an Emergency Use Authorization (EUA). This EUA will remain in effect (meaning this test can be  used) for the duration of the COVID-19 declaration under Section 564(b)(1) of the Act, 21 U.S.C. section 360bbb-3(b)(1), unless the authorization is terminated or revoked.  Performed at [facility], [place] [address], [place], [place] [address]      Labs: Basic Metabolic Panel: Recent Labs  Lab 02/06/23 1207 02/07/23 0557  NA 133* 135  K 2.9* 4.2  CL 102 103  CO2 18* 24  GLUCOSE 131* 115*  BUN 6 10  CREATININE 1.03* 0.83  CALCIUM 9.0 9.2   Liver Function Tests: Recent Labs  Lab 02/06/23 1207  AST 29  ALT 19  ALKPHOS 59  BILITOT 0.4  PROT 7.6  ALBUMIN 3.8   No results for input(s): "LIPASE", "AMYLASE" in the last 168 hours. No results  for input(s): "AMMONIA" in the last 168 hours. CBC: Recent Labs  Lab 02/06/23 1207  WBC 6.7  NEUTROABS 3.8  HGB 12.8  HCT 40.3  MCV 75.5*  PLT 239   Cardiac Enzymes: No results for input(s): "CKTOTAL", "CKMB", "CKMBINDEX", "TROPONINI" in the last 168 hours. BNP: BNP (last 3 results) No results for input(s): "BNP" in the last 8760 hours.  ProBNP (last 3 results) No results for input(s): "PROBNP" in the last 8760 hours.  CBG: No results for input(s): "GLUCAP" in the last 168 hours.     Signed:  Fayrene Helper MD.  [facility] 02/07/2023, 9:02 AM

## 2023-02-07 NOTE — TOC Transition Note (Addendum)
Transition of Care [facility]) - CM/SW Discharge Note   Patient Details  Name: Margaret Terry MRN: [id number] Date of Birth: 28-Dec-1995  [facility] CM/SW Contact:  Curlene Labrum, RN Phone Number: 02/07/2023, 9:25 AM   Clinical Narrative:    CM spoke with Dr. Marcelline Deist, attending and nebulizer is needed for home.  I called [employer] and spoke with Brenton Grills, CM with [employer] and requested Nebulizer machine be ordered and delivered to the patient's room this morning before she is discharged home.  02/07/2023 0930 - CM met with the patient at the bedside and patient requested discharged medications be sent to [facility].  TOC pharmacy is aware.  [employer] will deliver nebulizer machine to room this morning as ordered.  PCP set up with [facility].  Instructions left in AVS regarding smoking and vaping cessation along with instructions for nebulizer use and asthma.  Patient can be discharged home with family once her nebulizer machine is delivered to the hospital room.           Patient Goals and CMS Choice      Discharge Placement                         Discharge Plan and Services Additional resources added to the After Visit Summary for                                       Social Determinants of Health (SDOH) Interventions SDOH Screenings   Food Insecurity: No Food Insecurity (02/07/2023)  Housing: Low Risk  (02/07/2023)  Transportation Needs: No Transportation Needs (02/07/2023)  Utilities: Not At Risk (02/07/2023)  Tobacco Use: High Risk (08/12/2022)     Readmission Risk Interventions     No data to display

## 2023-02-07 NOTE — Care Plan (Signed)
Patient Margaret Terry, ambulating in room, family/friend at bedside. Patient has no complains at this time, waiting breathing equipment and after to leave home. She doesn't report SOB or chest pain. Lungs clear, diminished on R side, has non-productive cough. No PRN meds given. Care plan discussed with patient and discharge instructions were provided to the patient.

## 2023-02-19 ENCOUNTER — Inpatient Hospital Stay: Payer: Medicaid Other | Admitting: Family Medicine

## 2023-11-03 ENCOUNTER — Ambulatory Visit (HOSPITAL_COMMUNITY)
Admission: EM | Admit: 2023-11-03 | Discharge: 2023-11-03 | Disposition: A | Discharge status: Home / Self Care | Payer: Medicaid Other | Attending: Family Medicine | Admitting: Family Medicine

## 2023-11-03 ENCOUNTER — Encounter (HOSPITAL_COMMUNITY): Payer: Self-pay

## 2023-11-03 ENCOUNTER — Ambulatory Visit (INDEPENDENT_AMBULATORY_CARE_PROVIDER_SITE_OTHER): Payer: Medicaid Other

## 2023-11-03 DIAGNOSIS — M79645 Pain in left finger(s): Secondary | ICD-10-CM | POA: Diagnosis not present

## 2023-11-03 DIAGNOSIS — N760 Acute vaginitis: Secondary | ICD-10-CM | POA: Diagnosis present

## 2023-11-03 DIAGNOSIS — N898 Other specified noninflammatory disorders of vagina: Secondary | ICD-10-CM | POA: Diagnosis present

## 2023-11-03 DIAGNOSIS — G8929 Other chronic pain: Secondary | ICD-10-CM | POA: Insufficient documentation

## 2023-11-03 DIAGNOSIS — R300 Dysuria: Secondary | ICD-10-CM | POA: Insufficient documentation

## 2023-11-03 LAB — POCT URINALYSIS DIP (MANUAL ENTRY)
Bilirubin, UA: NEGATIVE
Glucose, UA: NEGATIVE mg/dL
Nitrite, UA: POSITIVE — AB
Spec Grav, UA: >=1.03 — AB (ref 1.010–1.025)
Urobilinogen, UA: 0.2 U/dL
pH, UA: 6.5 (ref 5.0–8.0)

## 2023-11-03 LAB — POCT URINE PREGNANCY: Preg Test, Ur: NEGATIVE

## 2023-11-03 MED ORDER — NITROFURANTOIN MONOHYD MACRO 100 MG PO CAPS: 100.0000 mg | ORAL_CAPSULE | Freq: Two times a day (BID) | ORAL | 0 refills | Status: DC

## 2023-11-03 MED ORDER — IBUPROFEN 800 MG PO TABS: 800.0000 mg | ORAL_TABLET | Freq: Three times a day (TID) | ORAL | 0 refills | Status: DC | PRN

## 2023-11-03 NOTE — ED Triage Notes (Signed)
Pt presents with left thumb pain after horse playing around yesterday.

## 2023-11-03 NOTE — ED Notes (Signed)
Pt is asking for results from visit on 11/03/23 to called mother at [phone]

## 2023-11-03 NOTE — Discharge Instructions (Addendum)
There is no broken bone on your x-ray.  Urinalysis does show some red blood cells and white blood cells and nitrites, consistent with bladder infection.  Pregnancy test is negative  Staff will notify you if there is anything positive on the swab.  Take ibuprofen 800 mg--1 tab every 8 hours as needed for pain.  Take nitrofurantoin 100 mg--1 capsule 2 times daily for 5 days  Ice the sore thumb.
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
## 2023-11-04 ENCOUNTER — Telehealth (HOSPITAL_COMMUNITY): Payer: Self-pay

## 2023-11-04 LAB — CERVICOVAGINAL ANCILLARY ONLY
Bacterial Vaginitis (gardnerella): POSITIVE — AB
Candida Glabrata: NEGATIVE
Candida Vaginitis: POSITIVE — AB
Chlamydia: NEGATIVE
Comment: NEGATIVE
Comment: NEGATIVE
Comment: NEGATIVE
Comment: NEGATIVE
Comment: NEGATIVE
Comment: NORMAL
Neisseria Gonorrhea: NEGATIVE
Trichomonas: POSITIVE — AB

## 2023-11-04 MED ORDER — FLUCONAZOLE 150 MG PO TABS: 150.0000 mg | ORAL_TABLET | Freq: Once | ORAL | 0 refills | Status: AC

## 2023-11-04 MED ORDER — METRONIDAZOLE 500 MG PO TABS: 500.0000 mg | ORAL_TABLET | Freq: Two times a day (BID) | ORAL | 0 refills | Status: AC

## 2023-11-04 NOTE — Telephone Encounter (Signed)
Per protocol, pt requires tx with metronidazole and Diflucan. Attempted to reach patient x1. LVM. Rx sent to pharmacy on file.

## 2023-11-05 LAB — URINE CULTURE: Culture: >=100000 — AB

## 2024-02-04 ENCOUNTER — Other Ambulatory Visit: Payer: Self-pay

## 2024-02-04 ENCOUNTER — Emergency Department (HOSPITAL_COMMUNITY)
Admission: EM | Admit: 2024-02-04 | Discharge: 2024-02-05 | Discharge status: Against Medical Advice | Payer: Self-pay | Attending: Emergency Medicine | Admitting: Emergency Medicine

## 2024-02-04 ENCOUNTER — Emergency Department (HOSPITAL_COMMUNITY): Payer: Self-pay

## 2024-02-04 ENCOUNTER — Encounter (HOSPITAL_COMMUNITY): Payer: Self-pay | Admitting: Emergency Medicine

## 2024-02-04 DIAGNOSIS — S0990XA Unspecified injury of head, initial encounter: Secondary | ICD-10-CM | POA: Insufficient documentation

## 2024-02-04 DIAGNOSIS — Z5321 Procedure and treatment not carried out due to patient leaving prior to being seen by health care provider: Secondary | ICD-10-CM | POA: Insufficient documentation

## 2024-02-04 DIAGNOSIS — Z23 Encounter for immunization: Secondary | ICD-10-CM | POA: Insufficient documentation

## 2024-02-04 DIAGNOSIS — X58XXXA Exposure to other specified factors, initial encounter: Secondary | ICD-10-CM | POA: Insufficient documentation

## 2024-02-04 MED ORDER — OXYCODONE-ACETAMINOPHEN 5-325 MG PO TABS
1.0000 | ORAL_TABLET | ORAL | Status: DC | PRN
  Administered 2024-02-04: 1 via ORAL
  Filled 2024-02-04: qty 1

## 2024-02-04 MED ORDER — TETANUS-DIPHTH-ACELL PERTUSSIS 5-2.5-18.5 LF-MCG/0.5 IM SUSY
0.5000 mL | PREFILLED_SYRINGE | Freq: Once | INTRAMUSCULAR | Status: AC
  Administered 2024-02-04: 0.5 mL via INTRAMUSCULAR
  Filled 2024-02-04: qty 0.5
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
## 2024-02-05 ENCOUNTER — Telehealth: Payer: Self-pay | Admitting: *Deleted

## 2024-02-05 NOTE — Telephone Encounter (Signed)
 Pt called regarding MyChart activation assistance.  RNCM sent link to phone number provided.

## 2024-03-12 ENCOUNTER — Ambulatory Visit (HOSPITAL_COMMUNITY)
Admission: EM | Admit: 2024-03-12 | Discharge: 2024-03-12 | Disposition: A | Discharge status: Home / Self Care | Payer: Self-pay | Attending: Emergency Medicine | Admitting: Emergency Medicine

## 2024-03-12 ENCOUNTER — Encounter (HOSPITAL_COMMUNITY): Payer: Self-pay

## 2024-03-12 ENCOUNTER — Emergency Department (HOSPITAL_COMMUNITY): Admission: EM | Admit: 2024-03-12 | Discharge: 2024-03-12 | Discharge status: Against Medical Advice | Payer: Self-pay

## 2024-03-12 DIAGNOSIS — R42 Dizziness and giddiness: Secondary | ICD-10-CM

## 2024-03-12 DIAGNOSIS — K0889 Other specified disorders of teeth and supporting structures: Secondary | ICD-10-CM

## 2024-03-12 DIAGNOSIS — K047 Periapical abscess without sinus: Secondary | ICD-10-CM

## 2024-03-12 MED ORDER — KETOROLAC TROMETHAMINE 30 MG/ML IJ SOLN
INTRAMUSCULAR | Status: AC
  Filled 2024-03-12: qty 1

## 2024-03-12 MED ORDER — KETOROLAC TROMETHAMINE 30 MG/ML IJ SOLN
30.0000 mg | Freq: Once | INTRAMUSCULAR | Status: AC
  Administered 2024-03-12: 30 mg via INTRAMUSCULAR

## 2024-03-12 MED ORDER — NYSTATIN 100000 UNIT/ML MT SUSP: 5.0000 mL | Freq: Three times a day (TID) | OROMUCOSAL | 0 refills | Status: AC

## 2024-03-12 MED ORDER — CLINDAMYCIN HCL 300 MG PO CAPS: 300.0000 mg | ORAL_CAPSULE | Freq: Three times a day (TID) | ORAL | 0 refills | Status: AC

## 2024-03-12 MED ORDER — IBUPROFEN 800 MG PO TABS: 800.0000 mg | ORAL_TABLET | Freq: Three times a day (TID) | ORAL | 0 refills | Status: AC

## 2024-03-12 NOTE — Discharge Instructions (Addendum)
 Starting clindamycin 3 times daily for 7 days for dental infection coverage. You can use the Magic mouthwash 3 times daily to assist with pain. Otherwise you can take 800 mg of ibuprofen 3 times daily as needed. You can take 500 to 1000 mg of Tylenol every 4-6 hours as needed for breakthrough pain.  Do not exceed 4000 mg in a day. I have attached dental resources to paperwork that you can call to follow-up with. Return here as needed.

## 2024-03-12 NOTE — ED Notes (Signed)
 Pt left AMA

## 2024-03-12 NOTE — ED Triage Notes (Signed)
 Pt c/o lt lower toothache with swelling x2 months. Requesting dental resources.  Pt c/o dizziness with near syncope x2wks.  Denies taken any meds for sx's
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
## 2024-03-19 ENCOUNTER — Emergency Department (HOSPITAL_COMMUNITY)
Admission: EM | Admit: 2024-03-19 | Discharge: 2024-03-19 | Disposition: A | Discharge status: Home / Self Care | Payer: Self-pay | Attending: Emergency Medicine | Admitting: Emergency Medicine

## 2024-03-19 ENCOUNTER — Encounter (HOSPITAL_COMMUNITY): Payer: Self-pay

## 2024-03-19 ENCOUNTER — Other Ambulatory Visit: Payer: Self-pay

## 2024-03-19 DIAGNOSIS — K047 Periapical abscess without sinus: Secondary | ICD-10-CM

## 2024-03-19 DIAGNOSIS — R300 Dysuria: Secondary | ICD-10-CM | POA: Insufficient documentation

## 2024-03-19 DIAGNOSIS — K029 Dental caries, unspecified: Secondary | ICD-10-CM | POA: Insufficient documentation

## 2024-03-19 DIAGNOSIS — N390 Urinary tract infection, site not specified: Secondary | ICD-10-CM

## 2024-03-19 LAB — COMPREHENSIVE METABOLIC PANEL WITH GFR
ALT: 15 U/L (ref 0–44)
AST: 20 U/L (ref 15–41)
Albumin: 3.6 g/dL (ref 3.5–5.0)
Alkaline Phosphatase: 58 U/L (ref 38–126)
Anion gap: 8 (ref 5–15)
BUN: 11 mg/dL (ref 6–20)
CO2: 23 mmol/L (ref 22–32)
Calcium: 8.9 mg/dL (ref 8.9–10.3)
Chloride: 105 mmol/L (ref 98–111)
Creatinine, Ser: 0.92 mg/dL (ref 0.44–1.00)
GFR, Estimated: >60 mL/min (ref >60)
Glucose, Bld: 115 mg/dL — ABNORMAL HIGH (ref 70–99)
Potassium: 3.3 mmol/L — ABNORMAL LOW (ref 3.5–5.1)
Sodium: 136 mmol/L (ref 135–145)
Total Bilirubin: 0.3 mg/dL (ref 0.0–1.2)
Total Protein: 7.3 g/dL (ref 6.5–8.1)

## 2024-03-19 LAB — CBC
HCT: 38.5 % (ref 36.0–46.0)
Hemoglobin: 11.7 g/dL — ABNORMAL LOW (ref 12.0–15.0)
MCH: 23.4 pg — ABNORMAL LOW (ref 26.0–34.0)
MCHC: 30.4 g/dL (ref 30.0–36.0)
MCV: 77.2 fL — ABNORMAL LOW (ref 80.0–100.0)
Platelets: 302 10*3/uL (ref 150–400)
RBC: 4.99 MIL/uL (ref 3.87–5.11)
RDW: 19.6 % — ABNORMAL HIGH (ref 11.5–15.5)
WBC: 5.1 10*3/uL (ref 4.0–10.5)
nRBC: 0 % (ref 0.0–0.2)

## 2024-03-19 LAB — URINALYSIS, ROUTINE W REFLEX MICROSCOPIC
Bilirubin Urine: NEGATIVE
Glucose, UA: NEGATIVE mg/dL
Hgb urine dipstick: NEGATIVE
Ketones, ur: 20 mg/dL — AB
Nitrite: POSITIVE — AB
Protein, ur: 30 mg/dL — AB
Specific Gravity, Urine: 1.029 (ref 1.005–1.030)
pH: 5 (ref 5.0–8.0)

## 2024-03-19 LAB — LIPASE, BLOOD: Lipase: 21 U/L (ref 11–51)

## 2024-03-19 LAB — HCG, SERUM, QUALITATIVE: Preg, Serum: NEGATIVE

## 2024-03-19 MED ORDER — MELOXICAM 7.5 MG PO TABS: 7.5000 mg | ORAL_TABLET | Freq: Every day | ORAL | 0 refills | Status: AC

## 2024-03-19 MED ORDER — POTASSIUM CHLORIDE 20 MEQ PO PACK
20.0000 meq | PACK | Freq: Once | ORAL | Status: AC
  Administered 2024-03-19: 20 meq via ORAL
  Filled 2024-03-19: qty 1

## 2024-03-19 MED ORDER — CEPHALEXIN 500 MG PO CAPS
500.0000 mg | ORAL_CAPSULE | Freq: Once | ORAL | Status: AC
  Administered 2024-03-19: 500 mg via ORAL
  Filled 2024-03-19: qty 1

## 2024-03-19 MED ORDER — CEPHALEXIN 500 MG PO CAPS: 500.0000 mg | ORAL_CAPSULE | Freq: Four times a day (QID) | ORAL | 0 refills | Status: AC

## 2024-03-19 MED ORDER — IBUPROFEN 800 MG PO TABS
800.0000 mg | ORAL_TABLET | Freq: Once | ORAL | Status: AC
  Administered 2024-03-19: 800 mg via ORAL
  Filled 2024-03-19: qty 1

## 2024-03-19 NOTE — ED Provider Notes (Signed)
 Margaret Terry EMERGENCY DEPARTMENT AT [facility] Provider Note   CSN: [id number] Arrival date & time: 03/19/24  1300     History Chief Complaint  Patient presents with   Emesis     Emesis Associated symptoms: no chills and no fever    Margaret Terry is a 28 y.o. female presenting for acute medical concern of pain in the teeth of the lower left mandible.  She states that she has a fractured molar on the left mandible that the pain has been worsening over the last 3 days.  Initially responsive to oral ibuprofen, but today she presents to the ED because the pain has worsened and is not responding to ibuprofen.  She also has unassociated complaints of generalized weakness and of back pain.  She further states that she has had intermittent dysuria over the last week, has also had general malaise.   Patient's recorded medical, surgical, social, medication list and allergies were reviewed in the Snapshot window as part of the initial history.   Review of Systems   Review of Systems  Constitutional:  Positive for fatigue. Negative for activity change, appetite change, chills, diaphoresis and fever.  Gastrointestinal:  Positive for vomiting.  Genitourinary:  Positive for dysuria. Negative for frequency.    Physical Exam Updated Vital Signs BP 125/81 (BP Location: Right Arm)   Pulse 79   Temp 98.8 F (37.1 C) (Oral)   Resp 16   Ht 5\' 10"  (1.778 m)   Wt 100 kg   SpO2 100%   BMI 31.63 kg/m  Physical Exam Vitals and nursing note reviewed.  Constitutional:      General: She is not in acute distress.    Appearance: Normal appearance.  HENT:     Head: Normocephalic and atraumatic.     Mouth/Throat:     Mouth: Mucous membranes are moist.     Dentition: Abnormal dentition. Dental caries present.     Pharynx: Oropharynx is clear.     Comments: Left mandibular molar noted to be fractured, and tenderness to tapping of surrounding teeth is present.  Also noted mild edema of  the gingiva surrounding the area and tenderness to the left l mandible. Eyes:     Extraocular Movements: Extraocular movements intact.     Conjunctiva/sclera: Conjunctivae normal.     Pupils: Pupils are equal, round, and reactive to light.  Cardiovascular:     Rate and Rhythm: Normal rate and regular rhythm.     Pulses: Normal pulses.     Heart sounds: Normal heart sounds. No murmur heard.    No friction rub. No gallop.  Pulmonary:     Effort: Pulmonary effort is normal.     Breath sounds: Normal breath sounds.  Abdominal:     General: Abdomen is flat. Bowel sounds are normal.     Palpations: Abdomen is soft.     Tenderness: There is no abdominal tenderness. There is no right CVA tenderness or left CVA tenderness.  Musculoskeletal:        General: Normal range of motion.     Cervical back: Normal range of motion and neck supple.     Right lower leg: No edema.     Left lower leg: No edema.  Skin:    General: Skin is warm and dry.     Capillary Refill: Capillary refill takes less than 2 seconds.  Neurological:     General: No focal deficit present.     Mental Status: She is alert. Mental  status is at baseline.  Psychiatric:        Mood and Affect: Mood normal.      ED Course/ Medical Decision Making/ A&P Clinical Course as of 03/19/24 2010  Thu Mar 19, 2024  2007 Stable 27 YOF with a chief complaint of dental pain. Also had some vomiting and headache. [CC]  2009 Squamous Epithelial / HPF: 11-20 Dirty urine. No localizing symptoms  [CC]    Clinical Course User Index [CC] Onetha Bile, MD    Procedures Procedures   Medications Ordered in ED Medications - No data to display  Medical Decision Making:   [name] is a 28 y.o. female who presented to the ED today with dental pain detailed above.     Complete initial physical exam performed, notably the patient was nontoxic-appearing, and in no apparent distress.  Noted fractured left mandibular molar.   Further had tenderness along the angle of the mandible going up towards the ear.    Reviewed and confirmed nursing documentation for past medical history, family history, social history.    Initial Assessment:   With the patient's presentation of dental pain, most likely diagnosis is dental abscess. Initial Plan:  Initiate oral antibiotics and NSAIDs. Screening labs including CBC and Metabolic panel to evaluate for infectious or metabolic etiology of disease.  Urinalysis with reflex culture ordered to evaluate for UTI or relevant urologic/nephrologic pathology.  Objective evaluation as below reviewed   Initial Study Results:   Laboratory  All laboratory results reviewed without evidence of clinically relevant pathology.  Noted in urine increased nitrates and leukocytes that will questionable reliability due to presence of squamous epithelial cells in the sample.  Reassessment and Plan:   Initial urine shows possible UTI.  Along with physical exam findings and patient history, will manage empirically for UTI.   Given exam findings of dental pain with dental fracture and other physical exam findings as noted, will manage with outpatient antibiotics and dental referral.  Clinical Impression: No diagnosis found.   Data Unavailable   Final Clinical Impression(s) / ED Diagnoses Final diagnoses:  None    Rx / DC Orders ED Discharge Orders     None         Juanetta Nordmann, PA 03/19/24 2043    Onetha Bile, MD 03/19/24 2348

## 2024-03-19 NOTE — ED Triage Notes (Addendum)
 Patient has been vomiting all night, feeling weak, has a headache and back ache. Vomited x3. No diarrhea. Complaining of left lower tooth pain.

## 2024-05-13 ENCOUNTER — Encounter (HOSPITAL_COMMUNITY): Payer: Self-pay

## 2024-05-13 ENCOUNTER — Ambulatory Visit (HOSPITAL_COMMUNITY)
Admission: EM | Admit: 2024-05-13 | Discharge: 2024-05-13 | Disposition: A | Discharge status: Home / Self Care | Payer: Self-pay | Attending: Family Medicine | Admitting: Family Medicine

## 2024-05-13 DIAGNOSIS — Z3202 Encounter for pregnancy test, result negative: Secondary | ICD-10-CM

## 2024-05-13 DIAGNOSIS — N920 Excessive and frequent menstruation with regular cycle: Secondary | ICD-10-CM

## 2024-05-13 LAB — POCT URINE PREGNANCY: Preg Test, Ur: NEGATIVE

## 2024-05-13 NOTE — ED Triage Notes (Addendum)
 Pt present with vaginal bleeding nd blood clots x one week. States she started her cycle 06/04 and c/o heavy bleeding. Pt c/o mid lower abd pain. States her cycle usually last a few days.

## 2024-05-13 NOTE — Discharge Instructions (Addendum)
 Menorrhagia with regular cycle: Urine pregnancy test is negative.  Patient is reporting menstrual cycle that lasted 7 days.  She reports at this point she is bleeding through a pad (pad is completely soaked) hourly.  Advised that she needs to go to the emergency room for additional blood work and workup.  There is a risk that she might need fluids due to excessive blood loss.  Follow-up here as needed.  Would benefit from seeing gynecology.
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
## 2024-05-18 ENCOUNTER — Other Ambulatory Visit: Payer: Self-pay

## 2024-05-18 ENCOUNTER — Encounter (HOSPITAL_COMMUNITY): Payer: Self-pay

## 2024-05-18 ENCOUNTER — Emergency Department (HOSPITAL_COMMUNITY)
Admission: EM | Admit: 2024-05-18 | Discharge: 2024-05-19 | Discharge status: Against Medical Advice | Payer: Self-pay | Attending: Emergency Medicine | Admitting: Emergency Medicine

## 2024-05-18 DIAGNOSIS — N939 Abnormal uterine and vaginal bleeding, unspecified: Secondary | ICD-10-CM | POA: Insufficient documentation

## 2024-05-18 DIAGNOSIS — Z5321 Procedure and treatment not carried out due to patient leaving prior to being seen by health care provider: Secondary | ICD-10-CM | POA: Insufficient documentation

## 2024-05-18 LAB — CBC WITH DIFFERENTIAL/PLATELET
Abs Immature Granulocytes: 0.02 10*3/uL (ref 0.00–0.07)
Basophils Absolute: 0 10*3/uL (ref 0.0–0.1)
Basophils Relative: 1 %
Eosinophils Absolute: 0.2 10*3/uL (ref 0.0–0.5)
Eosinophils Relative: 3 %
HCT: 33.9 % — ABNORMAL LOW (ref 36.0–46.0)
Hemoglobin: 10.7 g/dL — ABNORMAL LOW (ref 12.0–15.0)
Immature Granulocytes: 0 %
Lymphocytes Relative: 27 %
Lymphs Abs: 1.7 10*3/uL (ref 0.7–4.0)
MCH: 24.5 pg — ABNORMAL LOW (ref 26.0–34.0)
MCHC: 31.6 g/dL (ref 30.0–36.0)
MCV: 77.6 fL — ABNORMAL LOW (ref 80.0–100.0)
Monocytes Absolute: 0.5 10*3/uL (ref 0.1–1.0)
Monocytes Relative: 7 %
Neutro Abs: 4 10*3/uL (ref 1.7–7.7)
Neutrophils Relative %: 62 %
Platelets: 296 10*3/uL (ref 150–400)
RBC: 4.37 MIL/uL (ref 3.87–5.11)
RDW: 18.8 % — ABNORMAL HIGH (ref 11.5–15.5)
WBC: 6.3 10*3/uL (ref 4.0–10.5)
nRBC: 0 % (ref 0.0–0.2)

## 2024-05-18 LAB — BASIC METABOLIC PANEL WITH GFR
Anion gap: 7 (ref 5–15)
BUN: 11 mg/dL (ref 6–20)
CO2: 22 mmol/L (ref 22–32)
Calcium: 8.5 mg/dL — ABNORMAL LOW (ref 8.9–10.3)
Chloride: 108 mmol/L (ref 98–111)
Creatinine, Ser: 0.84 mg/dL (ref 0.44–1.00)
GFR, Estimated: >60 mL/min (ref >60)
Glucose, Bld: 101 mg/dL — ABNORMAL HIGH (ref 70–99)
Potassium: 3.9 mmol/L (ref 3.5–5.1)
Sodium: 137 mmol/L (ref 135–145)

## 2024-05-18 LAB — URINALYSIS, ROUTINE W REFLEX MICROSCOPIC
Bilirubin Urine: NEGATIVE
Glucose, UA: NEGATIVE mg/dL
Ketones, ur: NEGATIVE mg/dL
Leukocytes,Ua: NEGATIVE
Nitrite: NEGATIVE
Protein, ur: NEGATIVE mg/dL
Specific Gravity, Urine: 1.026 (ref 1.005–1.030)
pH: 5 (ref 5.0–8.0)

## 2024-05-18 LAB — RAPID HIV SCREEN (HIV 1/2 AB+AG)
HIV 1/2 Antibodies: NONREACTIVE
HIV-1 P24 Antigen - HIV24: NONREACTIVE

## 2024-05-18 LAB — PREGNANCY, URINE: Preg Test, Ur: NEGATIVE

## 2024-05-18 NOTE — ED Provider Triage Note (Signed)
 Emergency Medicine Provider Triage Evaluation Note  Margaret Terry , a 28 y.o. female  was evaluated in triage.  Pt complains of vaginal bleeding x 3 weeks.  Review of Systems  Positive: Bleeding, cramping Negative: Vaginal discharge, fever, chills, nausea, vomiting, dizziness  Physical Exam  BP 120/66 (BP Location: Right Arm)   Pulse 80   Temp 98.4 F (36.9 C)   Resp 16   LMP 05/06/2024 (Exact Date)   SpO2 100%  Gen:   Awake, no distress   Resp:  Normal effort  MSK:   Moves extremities without difficulty  Other:    Medical Decision Making  Medically screening exam initiated at 7:12 PM.  Appropriate orders placed.  Margaret Terry was informed that the remainder of the evaluation will be completed by another provider, this initial triage assessment does not replace that evaluation, and the importance of remaining in the ED until their evaluation is complete.  Labs ordered   Margaret Terry N, PA-C 05/18/24 1913

## 2024-05-18 NOTE — ED Triage Notes (Signed)
 Pt to ED c/o vaginal bleeding. Reports vaginal bleeding x 3 weeks, evaluated at [facility] for the same. Pt also requesting STD screening.

## 2024-05-19 LAB — RPR: RPR Ser Ql: NONREACTIVE

## 2024-05-19 NOTE — ED Notes (Signed)
Pt name called to go back to see provider, no response.

## 2024-05-20 LAB — GC/CHLAMYDIA PROBE AMP (~~LOC~~) NOT AT ARMC
Chlamydia: NEGATIVE
Comment: NEGATIVE
Comment: NORMAL
Neisseria Gonorrhea: NEGATIVE
